# Patient Record
Sex: Female | Born: 1935 | Race: White | Hispanic: No | Marital: Married | Smoking: Never smoker
Health system: Southern US, Community
[De-identification: ages and names within clinical notes are randomized; demographics above are authoritative.]

## PROBLEM LIST (undated history)

## (undated) DIAGNOSIS — I341 Nonrheumatic mitral (valve) prolapse: Secondary | ICD-10-CM

## (undated) DIAGNOSIS — Z8619 Personal history of other infectious and parasitic diseases: Secondary | ICD-10-CM

## (undated) DIAGNOSIS — K219 Gastro-esophageal reflux disease without esophagitis: Secondary | ICD-10-CM

## (undated) DIAGNOSIS — I10 Essential (primary) hypertension: Secondary | ICD-10-CM

## (undated) DIAGNOSIS — I4891 Unspecified atrial fibrillation: Secondary | ICD-10-CM

## (undated) DIAGNOSIS — E785 Hyperlipidemia, unspecified: Secondary | ICD-10-CM

## (undated) HISTORY — DX: Personal history of other infectious and parasitic diseases: Z86.19

## (undated) HISTORY — PX: ABDOMINAL HYSTERECTOMY: SHX81

## (undated) HISTORY — DX: Hyperlipidemia, unspecified: E78.5

## (undated) HISTORY — PX: COLON SURGERY: SHX602

## (undated) HISTORY — DX: Gastro-esophageal reflux disease without esophagitis: K21.9

## (undated) HISTORY — DX: Nonrheumatic mitral (valve) prolapse: I34.1

## (undated) HISTORY — PX: TOE SURGERY: SHX1073

## (undated) HISTORY — DX: Unspecified atrial fibrillation: I48.91

## (undated) HISTORY — DX: Essential (primary) hypertension: I10

---

## 2008-05-12 ENCOUNTER — Ambulatory Visit: Payer: Self-pay | Admitting: Unknown Physician Specialty

## 2008-10-26 ENCOUNTER — Encounter: Payer: Self-pay | Admitting: Cardiovascular Disease

## 2008-11-11 ENCOUNTER — Encounter: Payer: Self-pay | Admitting: Cardiovascular Disease

## 2008-11-23 ENCOUNTER — Ambulatory Visit: Payer: Self-pay | Admitting: Internal Medicine

## 2009-04-26 ENCOUNTER — Encounter: Payer: Self-pay | Admitting: Cardiovascular Disease

## 2009-05-04 ENCOUNTER — Ambulatory Visit: Payer: Self-pay | Admitting: Cardiovascular Disease

## 2009-05-04 DIAGNOSIS — I10 Essential (primary) hypertension: Secondary | ICD-10-CM | POA: Insufficient documentation

## 2009-05-04 DIAGNOSIS — R0789 Other chest pain: Secondary | ICD-10-CM

## 2009-05-04 DIAGNOSIS — I4891 Unspecified atrial fibrillation: Secondary | ICD-10-CM

## 2009-05-04 DIAGNOSIS — E785 Hyperlipidemia, unspecified: Secondary | ICD-10-CM

## 2009-05-04 DIAGNOSIS — I059 Rheumatic mitral valve disease, unspecified: Secondary | ICD-10-CM | POA: Insufficient documentation

## 2009-05-04 DIAGNOSIS — R0602 Shortness of breath: Secondary | ICD-10-CM

## 2009-05-07 ENCOUNTER — Ambulatory Visit: Payer: Self-pay | Admitting: Cardiovascular Disease

## 2009-05-07 ENCOUNTER — Inpatient Hospital Stay: Payer: Self-pay | Admitting: Internal Medicine

## 2009-05-17 ENCOUNTER — Encounter: Payer: Self-pay | Admitting: Cardiovascular Disease

## 2009-05-17 ENCOUNTER — Ambulatory Visit: Payer: Self-pay | Admitting: Cardiovascular Disease

## 2009-05-17 LAB — CONVERTED CEMR LAB: POC INR: 3.6

## 2009-05-24 ENCOUNTER — Ambulatory Visit: Payer: Self-pay | Admitting: Internal Medicine

## 2009-05-24 LAB — CONVERTED CEMR LAB: POC INR: 3

## 2009-06-07 ENCOUNTER — Ambulatory Visit: Payer: Self-pay | Admitting: Internal Medicine

## 2009-06-14 ENCOUNTER — Ambulatory Visit: Payer: Self-pay | Admitting: Cardiovascular Disease

## 2009-06-16 ENCOUNTER — Encounter: Payer: Self-pay | Admitting: Cardiovascular Disease

## 2009-06-17 ENCOUNTER — Ambulatory Visit: Payer: Self-pay | Admitting: Cardiovascular Disease

## 2009-06-20 LAB — CONVERTED CEMR LAB
CO2: 24 meq/L (ref 19–32)
Calcium: 9.6 mg/dL (ref 8.4–10.5)
Creatinine, Ser: 0.98 mg/dL (ref 0.40–1.20)
Hemoglobin: 14 g/dL (ref 12.0–15.0)
INR: 2.37 — ABNORMAL HIGH (ref <1.50)
MCHC: 32.5 g/dL (ref 30.0–36.0)
Platelets: 188 10*3/uL (ref 150–400)
Prothrombin Time: 25.7 s — ABNORMAL HIGH (ref 11.6–15.2)
RDW: 14.8 % (ref 11.5–15.5)

## 2009-06-21 ENCOUNTER — Ambulatory Visit: Payer: Self-pay | Admitting: Cardiovascular Disease

## 2009-06-29 ENCOUNTER — Encounter: Payer: Self-pay | Admitting: Cardiovascular Disease

## 2009-06-29 ENCOUNTER — Ambulatory Visit: Payer: Self-pay | Admitting: Cardiovascular Disease

## 2009-06-29 LAB — CONVERTED CEMR LAB: POC INR: 2.3

## 2009-07-27 ENCOUNTER — Ambulatory Visit: Payer: Self-pay | Admitting: Cardiovascular Disease

## 2009-08-03 ENCOUNTER — Ambulatory Visit: Payer: Self-pay | Admitting: Cardiovascular Disease

## 2009-08-03 LAB — CONVERTED CEMR LAB: POC INR: 1.5

## 2009-08-12 ENCOUNTER — Ambulatory Visit: Payer: Self-pay | Admitting: Cardiovascular Disease

## 2009-08-24 ENCOUNTER — Ambulatory Visit: Payer: Self-pay | Admitting: Cardiovascular Disease

## 2009-09-14 ENCOUNTER — Ambulatory Visit: Payer: Self-pay | Admitting: Cardiovascular Disease

## 2009-10-12 ENCOUNTER — Ambulatory Visit: Payer: Self-pay | Admitting: Cardiology

## 2009-10-12 LAB — CONVERTED CEMR LAB: POC INR: 2.4

## 2009-11-09 ENCOUNTER — Ambulatory Visit: Payer: Self-pay | Admitting: Cardiology

## 2009-11-28 ENCOUNTER — Ambulatory Visit: Payer: Self-pay | Admitting: Internal Medicine

## 2009-12-07 ENCOUNTER — Ambulatory Visit: Payer: Self-pay | Admitting: Cardiovascular Disease

## 2010-01-04 ENCOUNTER — Ambulatory Visit: Payer: Self-pay | Admitting: Cardiovascular Disease

## 2010-01-04 LAB — CONVERTED CEMR LAB: POC INR: 2

## 2010-02-01 ENCOUNTER — Ambulatory Visit: Payer: Self-pay | Admitting: Cardiovascular Disease

## 2010-02-01 ENCOUNTER — Encounter: Payer: Self-pay | Admitting: Cardiovascular Disease

## 2010-02-01 DIAGNOSIS — I10 Essential (primary) hypertension: Secondary | ICD-10-CM

## 2010-02-01 LAB — CONVERTED CEMR LAB: POC INR: 1.6

## 2010-02-15 ENCOUNTER — Ambulatory Visit: Admission: RE | Admit: 2010-02-15 | Discharge: 2010-02-15 | Payer: Self-pay | Source: Home / Self Care

## 2010-03-08 ENCOUNTER — Ambulatory Visit: Admission: RE | Admit: 2010-03-08 | Discharge: 2010-03-08 | Payer: Self-pay | Source: Home / Self Care

## 2010-03-14 NOTE — Medication Information (Signed)
Summary: CCR/NE  Anticoagulant Therapy  Managed by: Cloyde Reams, RN, BSN Referring MD: Mariah Milling PCP: Yates Decamp, MD Supervising MD: Mariah Milling Indication 1: Atrial Fibrillation Lab Used: [facility] [place] Site: [place] INR POC 2.3 INR RANGE 2.0-3.0  Dietary changes: no    Health status changes: yes       Details: Incr swelling B LE after dependency, worse at night resolves by am.    Bleeding/hemorrhagic complications: no    Recent/future hospitalizations: no    Any changes in medication regimen? no    Recent/future dental: no  Any missed doses?: no       Is patient compliant with meds? yes       Current Medications (verified): 1)  Protonix 40 Mg Tbec (Pantoprazole Sodium) .Marland Kitchen.. 1po Once Daily 2)  Metoprolol Tartrate 50 Mg Tabs (Metoprolol Tartrate) .... 1/2 By Mouth Two Times A Day 3)  Fish Oil 1000 Mg Caps (Omega-3 Fatty Acids) .Marland Kitchen.. 1 By Mouth Two Times A Day 4)  Calcium Carbonate 600 Mg Tabs (Calcium Carbonate) .Marland Kitchen.. 1 By Mouth Once Daily 5)  Vitamin C Cr 500 Mg Cr-Tabs (Ascorbic Acid) .Marland Kitchen.. 1 By Mouth Once Daily 6)  Vitamin D 1000 Unit Tabs (Cholecalciferol) .Marland Kitchen.. 1 By Mouth Once Daily As Needed 7)  Vitamin E 1000 Unit Caps (Vitamin E) .Marland Kitchen.. 1 By Mouth Once Daily 8)  Aspirin 81 Mg Tbec (Aspirin) .... Take One Tablet By Mouth Daily 9)  Warfarin Sodium 5 Mg Tabs (Warfarin Sodium) .... As Directed By Anticoagulation Clinic 10)  Furosemide 20 Mg Tabs (Furosemide) .... Take One Tablet By Mouth Daily 11)  Potassium Chloride Crys Cr 20 Meq Cr-Tabs (Potassium Chloride Crys Cr) .... Take One Tablet By Mouth Daily 12)  Ramipril 2.5 Mg Caps (Ramipril) .... Take One Capsule By Mouth Daily 13)  Amlodipine Besylate 10 Mg Tabs (Amlodipine Besylate) .... Take One Tablet By Mouth Daily  Allergies: 1)  ! Amoxicillin  Anticoagulation Management History:      The patient is taking warfarin and comes in today for a routine follow up visit.  Positive risk factors for  bleeding include an age of 73 years or older.  The bleeding index is 'intermediate risk'.  Positive CHADS2 values include History of HTN.  Negative CHADS2 values include Age > 73 years old.  Her last INR was 2.37.  Anticoagulation responsible provider: [place].  INR POC: 2.3.  Cuvette Lot#: [id number].  Exp: 07/2010.    Anticoagulation Management Assessment/Plan:      The patient's current anticoagulation dose is Warfarin sodium 5 mg tabs: as directed by anticoagulation clinic.  The target INR is 2.0-3.0.  The next INR is due 10/12/2009.  Anticoagulation instructions were given to patient.  Results were reviewed/authorized by Cloyde Reams, RN, BSN.  She was notified by Cloyde Reams RN.         Prior Anticoagulation Instructions: INR 2.4  Continue on same dosage 1 tablet daily.  Recheck in 3 weeks.    Current Anticoagulation Instructions: INR 2.3  Continue on same dosage 1 tablet daily.  Recheck in 4 weeks.

## 2010-03-14 NOTE — Medication Information (Signed)
Summary: rov/ewj  Anticoagulant Therapy  Managed by: Bethena Midget, RN, BSN Referring MD: Mariah Milling PCP: Yates Decamp, MD Supervising MD: Mariah Milling MD, TIM  Indication 1: Atrial Fibrillation Lab Used: [facility] [place] Site: [place] INR POC 2.0 INR RANGE 2.0-3.0  Dietary changes: no    Health status changes: no    Bleeding/hemorrhagic complications: no    Recent/future hospitalizations: no    Any changes in medication regimen? no    Recent/future dental: no  Any missed doses?: no       Is patient compliant with meds? yes       Allergies: 1)  ! Amoxicillin  Anticoagulation Management History:      The patient is taking warfarin and comes in today for a routine follow up visit.  Positive risk factors for bleeding include an age of 73 years or older.  The bleeding index is 'intermediate risk'.  Positive CHADS2 values include History of HTN.  Negative CHADS2 values include Age > 79 years old.  Her last INR was 2.37.  Anticoagulation responsible provider: Mariah Milling MD, TIM .  INR POC: 2.0.  Cuvette Lot#: [id number].  Exp: 01/2011.    Anticoagulation Management Assessment/Plan:      The patient's current anticoagulation dose is Warfarin sodium 5 mg tabs: as directed by anticoagulation clinic.  The target INR is 2.0-3.0.  The next INR is due 02/01/2010.  Anticoagulation instructions were given to patient.  Results were reviewed/authorized by Bethena Midget, RN, BSN.  She was notified by Bethena Midget, RN, BSN.         Prior Anticoagulation Instructions: INR 2.1  Continue on same dosage 1 tablet daily.  Recheck in 4 weeks.    Current Anticoagulation Instructions: INR 2.0 Today take extra 2.5mg  then Continue 5mg s daily. Recheck in 4 weeks.

## 2010-03-14 NOTE — Assessment & Plan Note (Signed)
Summary: EKG/AMD  Nurse Visit   Vital Signs:  Patient profile:   75 year old female Pulse rate:   70 / minute BP sitting:   110 / 74  (left arm) Cuff size:   large  Vitals Entered By: Cloyde Reams RN (July 27, 2009 11:00 AM)  Visit Type:  Nurse visit Referring Provider:  Yates Decamp, MD Primary Provider:  Yates Decamp, MD  CC:  Pt c/o feeling like she was going to pass out yesterday when reaching or bending became very nauseated.  History of Present Illness: EKG shows pt in a-fib, last EKG NSR holding Digoxin d/t bradycardia. Per Dr Mariah Milling coumadin is subtherapeutic so will not try to get pt back in rhythm at present d/t incr clot risk with low INR.  Pt's rate is controlled at present.  Will boost coumadin, have pt monitor HR and BP over the next week and make OV with Dr Mariah Milling and Coumadin for 1 week.     Patient Instructions: 1)  Your physician recommends that you schedule a follow-up appointment in: 1 week 2)  Your physician recommends that you continue on your current medications as directed. Please refer to the Current Medication list given to you today.   Allergies: 1)  ! Amoxicillin

## 2010-03-14 NOTE — Medication Information (Signed)
Summary: CCR/AMD  Anticoagulant Therapy  Managed by: Charlena Cross, RN, BSN Referring MD: Mariah Milling PCP: Yates Decamp, MD Supervising MD: Gala Romney MD, Archibald Marchetta Indication 1: Atrial Fibrillation Lab Used: [facility] [place] Site: [place] INR POC 2.9 INR RANGE 2.0-3.0  Dietary changes: no    Health status changes: no    Bleeding/hemorrhagic complications: no    Recent/future hospitalizations: no    Any changes in medication regimen? no    Recent/future dental: no  Any missed doses?: no       Is patient compliant with meds? yes       Allergies: 1)  ! Amoxicillin  Anticoagulation Management History:      The patient is taking warfarin and comes in today for a routine follow up visit.  Positive risk factors for bleeding include an age of 75 years or older.  The bleeding index is 'intermediate risk'.  Positive CHADS2 values include History of HTN.  Negative CHADS2 values include Age > 5 years old.  Anticoagulation responsible provider: Shekela Goodridge MD, Reuel Boom.  INR POC: 2.9.    Anticoagulation Management Assessment/Plan:      The patient's current anticoagulation dose is Warfarin sodium 5 mg tabs: as directed by anticoagulation clinic.  The target INR is 2.0-3.0.  The next INR is due 07/05/2009.  Anticoagulation instructions were given to patient.  Results were reviewed/authorized by Charlena Cross, RN, BSN.  She was notified by Charlena Cross, RN, BSN.         Prior Anticoagulation Instructions: extra serving of greens today then coumadin 5 mg daily with 2.5 mg on T TH Sa  Current Anticoagulation Instructions: The patient is to continue with the same dose of coumadin.  This dosage includes: coumadin 5 mg daily with 2.5 mg on T Th Sa

## 2010-03-14 NOTE — Medication Information (Signed)
Summary: Coumadin Clinic  Anticoagulant Therapy  Managed by: Cloyde Reams, RN, BSN Referring MD: Mariah Milling PCP: Yates Decamp, MD Supervising MD: Mariah Milling Indication 1: Atrial Fibrillation Lab Used: [facility] [place] Site: [place] INR POC 2.3 INR RANGE 2.0-3.0  Dietary changes: no    Health status changes: no    Bleeding/hemorrhagic complications: no    Recent/future hospitalizations: no    Any changes in medication regimen? no    Recent/future dental: no  Any missed doses?: no       Is patient compliant with meds? yes       Allergies: 1)  ! Amoxicillin  Anticoagulation Management History:      The patient is taking warfarin and comes in today for a routine follow up visit.  Positive risk factors for bleeding include an age of 75 years or older.  The bleeding index is 'intermediate risk'.  Positive CHADS2 values include History of HTN.  Negative CHADS2 values include Age > 32 years old.  Her last INR was 2.37.  Anticoagulation responsible provider: Jacory Kamel.  INR POC: 2.3.  Cuvette Lot#: [id number].  Exp: 09/2010.    Anticoagulation Management Assessment/Plan:      The patient's current anticoagulation dose is Warfarin sodium 5 mg tabs: as directed by anticoagulation clinic.  The target INR is 2.0-3.0.  The next INR is due 07/27/2009.  Anticoagulation instructions were given to patient.  Results were reviewed/authorized by Cloyde Reams, RN, BSN.  She was notified by Cloyde Reams RN.         Current Anticoagulation Instructions: INR 2.3  Continue on same dosage 5mg  daily except 2.5mg  on Tuesdays, Thursdays, and Saturdays.  Recheck in 4 weeks.

## 2010-03-14 NOTE — Medication Information (Signed)
Summary: rov/ewj  Anticoagulant Therapy  Managed by: Cloyde Reams, RN, BSN Referring MD: Mariah Milling PCP: Yates Decamp, MD Supervising MD: Shirlee Latch MD, Kyrene Longan Indication 1: Atrial Fibrillation Lab Used: [facility] [place] Site: [place] INR POC 2.1 INR RANGE 2.0-3.0  Dietary changes: no    Health status changes: no    Bleeding/hemorrhagic complications: no    Recent/future hospitalizations: no    Any changes in medication regimen? no    Recent/future dental: no  Any missed doses?: no       Is patient compliant with meds? yes       Allergies: 1)  ! Amoxicillin  Anticoagulation Management History:      The patient is taking warfarin and comes in today for a routine follow up visit.  Positive risk factors for bleeding include an age of 75 years or older.  The bleeding index is 'intermediate risk'.  Positive CHADS2 values include History of HTN.  Negative CHADS2 values include Age > 73 years old.  Her last INR was 2.37.  Anticoagulation responsible provider: Shirlee Latch MD, Fredrich Cory.  INR POC: 2.1.  Cuvette Lot#: [id number].  Exp: 12/2010.    Anticoagulation Management Assessment/Plan:      The patient's current anticoagulation dose is Warfarin sodium 5 mg tabs: as directed by anticoagulation clinic.  The target INR is 2.0-3.0.  The next INR is due 12/07/2009.  Anticoagulation instructions were given to patient.  Results were reviewed/authorized by Cloyde Reams, RN, BSN.  She was notified by Cloyde Reams RN.         Prior Anticoagulation Instructions: INR 2.4  Continue on same dosage 1 tablet daily.  Recheck in 4 weeks.    Current Anticoagulation Instructions: INR 2.1  Continue on same dosage 1 tablet daily.  Recheck in 4 weeks.

## 2010-03-14 NOTE — Progress Notes (Signed)
Summary: [facility]   Imported By: Harlon Flor 05/05/2009 08:48:32  _____________________________________________________________________  External Attachment:    Type:   Image     Comment:   External Document

## 2010-03-14 NOTE — Assessment & Plan Note (Signed)
Summary: F1W/CCR/AMD   Visit Type:  Follow-up Referring Provider:  Yates Decamp, MD Primary Provider:  Yates Decamp, MD  CC:  no cardiac problems.  History of Present Illness: 75 year old woman, patient of Dr. Dan Humphreys, presents for followup for atrial fibrillation.  She reports that overall she has been doing well though she does occasionally have days when she feels tired. We have been decreasing her medications do to the symptoms of bradycardia and fatigue. Initially after the cardioversion where we achieved normal sinus rhythm, we decreased her metoprolol from 50 b.i.d. for bradycardia to 25 b.i.d. She continued to feel malaise and fatigue and we held her digoxin.  last week we did an EKG and she had converted back to atrial fibrillation. She does not feel significantly different. 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 She does have some shortness of breath if she pulls and irrigation hose. She does not do any exercise program.  Echocardiogram March 27 shows mild to moderately depressed EF, ejection fraction 40%, this was noted while in atrial fibrillation. Mild-to-moderate mitral regurgitation, mildly elevated right ventricular systolic pressure. This was prior to her diuresis and rhythm control at [facility].  EKG today shows atrial fibrillation with ventricular rate 85 beats per minute, rare PVC, no significant ST or T wave changes. Left anterior fascicular block, right bundle branch block.  Current Medications (verified): 1)  Protonix 40 Mg Tbec (Pantoprazole Sodium) .Marland Kitchen.. 1po Once Daily 2)  Metoprolol Tartrate 50 Mg Tabs (Metoprolol Tartrate) .... 1/2 By Mouth Two Times A Day 3)  Fish Oil 1000 Mg Caps (Omega-3 Fatty Acids) .Marland Kitchen.. 1 By Mouth Two Times A Day 4)  Calcium Carbonate 600 Mg Tabs (Calcium Carbonate) .Marland Kitchen.. 1 By Mouth Once Daily 5)  Vitamin C Cr 500 Mg Cr-Tabs (Ascorbic Acid) .Marland Kitchen.. 1 By Mouth Once Daily 6)  Vitamin D 1000 Unit Tabs (Cholecalciferol) .Marland Kitchen.. 1 By  Mouth Once Daily As Needed 7)  Vitamin E 1000 Unit Caps (Vitamin E) .Marland Kitchen.. 1 By Mouth Once Daily 8)  Aspirin 81 Mg Tbec (Aspirin) .... Take One Tablet By Mouth Daily 9)  Warfarin Sodium 5 Mg Tabs (Warfarin Sodium) .... As Directed By Anticoagulation Clinic 10)  Furosemide 20 Mg Tabs (Furosemide) .... Take One Tablet By Mouth Daily 11)  Potassium Chloride Crys Cr 20 Meq Cr-Tabs (Potassium Chloride Crys Cr) .... Take One Tablet By Mouth Daily 12)  Ramipril 2.5 Mg Caps (Ramipril) .... Take One Capsule By Mouth Daily 13)  Amlodipine Besylate 10 Mg Tabs (Amlodipine Besylate) .... Take One Tablet By Mouth Daily  Allergies (verified): 1)  ! Amoxicillin  Past History:  Past Medical History: Last updated: 05/07/2009 Atrial Fibrillation [facility] Hyperlipidemia Hypertension Mitral Valve Prolapse  Past Surgical History: Last updated: 2009-05-07 Abdominal Hysterectomy-Total Colon cancer surgery toe sew back on  Family History: Last updated: May 07, 2009 Father: deceased 65: lots health problems, CABG Mother: deceased 67: thyroid cancer Sister: deceased 68: pancreatic cancer Brother: living: 2 healthy  Social History: Last updated: 05/07/2009 Retired  Married  Tobacco Use - No.  Alcohol Use - no Regular Exercise - yes Drug Use - no  Risk Factors: Alcohol Use: 0 (05/07/09) Caffeine Use: 1.5 cup (May 07, 2009) Exercise: yes (2009/05/07)  Risk Factors: Smoking Status: never (May 07, 2009)  Review of Systems  The patient denies fever, weight loss, weight gain, vision loss, decreased hearing, hoarseness, chest pain, syncope, dyspnea on exertion, peripheral edema, prolonged cough, abdominal pain, incontinence, muscle weakness, depression, and enlarged lymph nodes.  Fatigue  Vital Signs:  Patient profile:   75 year old female Height:      63 inches Weight:      162 pounds Pulse rate:   85 / minute BP sitting:   118 / 82  (left arm) Cuff size:   regular  Vitals Entered  By: Bishop Dublin, CMA (August 03, 2009 10:22 AM)  Physical Exam  General:  Well developed, well nourished, in no acute distress. Head:  normocephalic and atraumatic Neck:  Neck supple, no JVD. No masses, thyromegaly or abnormal cervical nodes. Lungs:  Clear bilaterally to auscultation and percussion. Heart:  Non-displaced PMI, chest non-tender; irregular rate and rhythm, S1, S2 with II/VI SEM  murmurs, no rubs or gallops. Carotid upstroke normal, no bruit. . Pedals normal pulses. No edema, no varicosities. Abdomen:  Bowel sounds positive; abdomen soft and non-tender without masses Msk:  Back normal, normal gait. Muscle strength and tone normal. Pulses:  pulses normal in all 4 extremities Extremities:  No clubbing or cyanosis. Neurologic:  Alert and oriented x 3. Skin:  Intact without lesions or rashes. Psych:  Normal affect.   Impression & Recommendations:  Problem # 1:  ATRIAL FIBRILLATION (ICD-427.31) after a cardioversion last month, she has converted back to atrial fibrillation. We had to decrease her rate and rhythm control medications 22 malaise and fatigue and this may have contributed to her converting back to her arrhythmia.  Given her diastolic dysfunction, mild to moderate mitral regurgitation with mitral valve prolapse, mildly depressed ejection fraction, we will manage her medically with rate control and warfarin. I have encouraged her to increase her exercise as she may not be doing much aerobic with her gardening.  I have mentioned to her that she may need a higher dose metoprolol or we may need to restart some digoxin if her rate increases with exertion and she gets chest tightness or more shortness of breath with heavy exertion. 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   Her INR is 1.5 today and we will increase her dose to achieve 2.0 or greater.  Her updated medication list for this problem includes:    Metoprolol  Tartrate 50 Mg Tabs (Metoprolol tartrate) .Marland Kitchen... 1/2 by mouth two times a day    Aspirin 81 Mg Tbec (Aspirin) .Marland Kitchen... Take one tablet by mouth daily    Warfarin Sodium 5 Mg Tabs (Warfarin sodium) .Marland Kitchen... As directed by anticoagulation clinic  Problem # 2:  MITRAL VALVE PROLAPSE (ICD-424.0) Mild-to-moderate mitral regurgitation with valve prolapse. This may also be contributing to some of her shortness of breath with exertion.  Her updated medication list for this problem includes:    Metoprolol Tartrate 50 Mg Tabs (Metoprolol tartrate) .Marland Kitchen... 1/2 by mouth two times a day    Furosemide 20 Mg Tabs (Furosemide) .Marland Kitchen... Take one tablet by mouth daily    Ramipril 2.5 Mg Caps (Ramipril) .Marland Kitchen... Take one capsule by mouth daily  Patient Instructions: 1)  Your physician wants you to follow-up in:  6 months  You will receive a reminder letter in the mail two months in advance. If you don't receive a letter, please call our office to schedule the follow-up appointment. Prescriptions: WARFARIN SODIUM 5 MG TABS (WARFARIN SODIUM) as directed by anticoagulation clinic  #40 x 3   Entered by:   Cloyde Reams RN   Authorized by:   Dossie Arbour MD   Signed by:   Cloyde Reams  RN on 08/03/2009   Method used:   Electronically to        [facility] (retail)       [address], [address]       [place], [place]  [address]       Ph: [phone]       Fax: [phone]   RxID:   [id number]

## 2010-03-14 NOTE — Medication Information (Signed)
Summary: CCR/GLC  Anticoagulant Therapy  Managed by: Cloyde Reams, RN, BSN Referring MD: Mariah Milling PCP: Yates Decamp, MD Supervising MD: Mariah Milling Indication 1: Atrial Fibrillation Lab Used: [facility] [place] Site: [place] INR POC 2.3 INR RANGE 2.0-3.0  Dietary changes: no     Bleeding/hemorrhagic complications: no     Any changes in medication regimen? no     Any missed doses?: no       Is patient compliant with meds? yes       Allergies: 1)  ! Amoxicillin  Anticoagulation Management History:      The patient is taking warfarin and comes in today for a routine follow up visit.  Positive risk factors for bleeding include an age of 73 years or older.  The bleeding index is 'intermediate risk'.  Positive CHADS2 values include History of HTN.  Negative CHADS2 values include Age > 73 years old.  Her last INR was 2.37.  Anticoagulation responsible provider: Braylon Grenda.  INR POC: 2.3.  Cuvette Lot#: [id number].  Exp: 10/2010.    Anticoagulation Management Assessment/Plan:      The patient's current anticoagulation dose is Warfarin sodium 5 mg tabs: as directed by anticoagulation clinic.  The target INR is 2.0-3.0.  The next INR is due 08/24/2009.  Anticoagulation instructions were given to patient.  Results were reviewed/authorized by Cloyde Reams, RN, BSN.  She was notified by Benedict Needy, RN.         Prior Anticoagulation Instructions: INR 1.5  Per Dr Mariah Milling Take 1.5 tablets today then start taking 1 tablet daily.  Recheck in 10 days.    Current Anticoagulation Instructions: INR 2.3  Continue same dose of 1 tab daily and recheck in 2 weeks.

## 2010-03-14 NOTE — Medication Information (Signed)
Summary: [facility]  Anticoagulant Therapy  Managed by: Cloyde Reams, RN, BSN Referring MD: Mariah Milling PCP: Yates Decamp, MD Supervising MD: Shirlee Latch MD, [place] Indication 1: Atrial Fibrillation Lab Used: [facility] [place] Site: [place] INR POC 2.1 INR RANGE 2.0-3.0  Dietary changes: no    Health status changes: no    Bleeding/hemorrhagic complications: no    Recent/future hospitalizations: no    Any changes in medication regimen? no    Recent/future dental: no  Any missed doses?: no       Is patient compliant with meds? yes       Allergies: 1)  ! Amoxicillin  Anticoagulation Management History:      The patient is taking warfarin and comes in today for a routine follow up visit.  Positive risk factors for bleeding include an age of 17 years or older.  The bleeding index is 'intermediate risk'.  Positive CHADS2 values include History of HTN.  Negative CHADS2 values include Age > 75 years old.  Her last INR was 2.37.  Anticoagulation responsible Anivea Velasques: Shirlee Latch MD, [place].  INR POC: 2.1.  Cuvette Lot#: [id number].  Exp: 2012.    Anticoagulation Management Assessment/Plan:      The patient's current anticoagulation dose is Warfarin sodium 5 mg tabs: as directed by anticoagulation clinic.  The target INR is 2.0-3.0.  The next INR is due 01/04/2010.  Anticoagulation instructions were given to patient.  Results were reviewed/authorized by Cloyde Reams, RN, BSN.  She was notified by Cloyde Reams RN.         Prior Anticoagulation Instructions: INR 2.1  Continue on same dosage 1 tablet daily.  Recheck in 4 weeks.    Current Anticoagulation Instructions: Same as Prior Instructions.

## 2010-03-14 NOTE — Assessment & Plan Note (Signed)
Summary: POST HOSPITAL F/U 1 WEEK PER Aurorah Schlachter   Visit Type:  post hospital Referring Provider:  Yates Decamp, MD Primary Provider:  Yates Decamp, MD  CC:  no chest discomfort, some reflux problems last night, some sob, and some edema but not bad..  History of Present Illness: 75 year old woman, patient of Dr. Dan Humphreys, presents for followup for atrial fibrillation.  she presents today for followup after her cardioversion last week. We successfully cardioverted her to normal sinus rhythm. She presents today and states that she does not feel as well as she was hoping that she would. She has malaise, weakness, fatigue. She has been keeping track of her blood pressure numbers and heart rates and her blood pressure has been well-controlled no heart rate is low in the 40s and 50s.  Echocardiogram March 27 shows mild to moderately depressed EF, ejection fraction 40%, this was noted while in nature fibrillation. Mild-to-moderate mitral regurgitation, mildly elevated right ventricular systolic pressure. This was prior to her diuresis and rhythm control at [facility].  Current Problems (verified): 1)  Encounter For Long-term Use of Anticoagulants  (ICD-V58.61) 2)  Other and Unspecified Hyperlipidemia  (ICD-272.4) 3)  Unspecified Essential Hypertension  (ICD-401.9) 4)  Mitral Valve Prolapse  (ICD-424.0) 5)  Shortness of Breath  (ICD-786.05) 6)  Other Chest Pain  (ICD-786.59) 7)  Atrial Fibrillation  (ICD-427.31)  Current Medications (verified): 1)  Protonix 40 Mg Tbec (Pantoprazole Sodium) .Marland Kitchen.. 1po Once Daily 2)  Metoprolol Tartrate 50 Mg Tabs (Metoprolol Tartrate) .... 1/2 By Mouth Two Times A Day 3)  Fish Oil 1000 Mg Caps (Omega-3 Fatty Acids) .Marland Kitchen.. 1 By Mouth Two Times A Day 4)  Calcium Carbonate 600 Mg Tabs (Calcium Carbonate) .Marland Kitchen.. 1 By Mouth Once Daily 5)  Vitamin C Cr 500 Mg Cr-Tabs (Ascorbic Acid) .Marland Kitchen.. 1 By Mouth Once Daily 6)  Vitamin D 1000 Unit Tabs (Cholecalciferol) .Marland Kitchen.. 1 By Mouth Once Daily As  Needed 7)  Vitamin E 1000 Unit Caps (Vitamin E) .Marland Kitchen.. 1 By Mouth Once Daily 8)  Aspirin 81 Mg Tbec (Aspirin) .... Take One Tablet By Mouth Daily 9)  Warfarin Sodium 5 Mg Tabs (Warfarin Sodium) .... As Directed By Anticoagulation Clinic 10)  Furosemide 20 Mg Tabs (Furosemide) .... Take One Tablet By Mouth Daily 11)  Potassium Chloride Crys Cr 20 Meq Cr-Tabs (Potassium Chloride Crys Cr) .... Take One Tablet By Mouth Daily 12)  Ramipril 2.5 Mg Caps (Ramipril) .... Take One Capsule By Mouth Daily 13)  Digoxin 0.25 Mg Tabs (Digoxin) .... Take One Tablet By Mouth Daily 14)  Amlodipine Besylate 10 Mg Tabs (Amlodipine Besylate) .... Take One Tablet By Mouth Daily  Allergies (verified): 1)  ! Amoxicillin  Past History:  Past Medical History: Last updated: 05-30-09 Atrial Fibrillation [facility] Hyperlipidemia Hypertension Mitral Valve Prolapse  Past Surgical History: Last updated: May 30, 2009 Abdominal Hysterectomy-Total Colon cancer surgery toe sew back on  Family History: Last updated: 05-30-09 Father: deceased 29: lots health problems, CABG Mother: deceased 64: thyroid cancer Sister: deceased 59: pancreatic cancer Brother: living: 2 healthy  Social History: Last updated: 05-30-2009 Retired  Married  Tobacco Use - No.  Alcohol Use - no Regular Exercise - yes Drug Use - no  Risk Factors: Alcohol Use: 0 (May 30, 2009) Caffeine Use: 1.5 cup (05-30-09) Exercise: yes (05/30/09)  Risk Factors: Smoking Status: never (May 30, 2009)  Review of Systems  The patient denies fever, weight loss, weight gain, vision loss, decreased hearing, hoarseness, chest pain, syncope, dyspnea on exertion, peripheral edema, prolonged  cough, abdominal pain, incontinence, muscle weakness, depression, and enlarged lymph nodes.         weak, malasie, food dose not taste right  Vital Signs:  Patient profile:   75 year old female Height:      63 inches Weight:      167.25 pounds BMI:      29.73 Pulse rate:   74 / minute Pulse rhythm:   regular BP sitting:   92 / 76  (left arm) Cuff size:   large  Vitals Entered By: Mercer Pod (Jun 29, 2009 2:29 PM)  Physical Exam  General:  well-appearing woman in no apparent distress, HEENT exam is benign, oropharynx is clear, neck is supple with no JVP or carotid bruit, heart sounds are regular with S1-S2 and no murmurs appreciated, lungs are clear to auscultation with no wheezes rales, abdominal exam is benign, no significant lower extremity edema, neurologic exam is nonfocal, skin is warm and dry, pulses are equal and symmetrical in her upper and lower extremities.    EKG  Procedure date:  06/29/2009  Findings:      normal sinus rhythm with a bigeminal pattern with PVCs, left anterior fascicular block.  Impression & Recommendations:  Problem # 1:  ATRIAL FIBRILLATION (ICD-427.31) she is maintaining normal sinus rhythm today. Her rate is slow though in a bigeminal pattern.  She does not feel well, and has bradycardia at home per her numbers though I suspect that some of this may be due to ectopy. In an effort to see if we can make her feel better with more energy, we'll hold her digoxin. I've asked her to continue to monitor her heart rate and blood pressure and contact our office if her heart rate is not increase.  We will have her follow up with an EKG in one month and follow up in our office in 3 months.  The following medications were removed from the medication list:    Digoxin 0.25 Mg Tabs (Digoxin) .Marland Kitchen... Take one tablet by mouth daily Her updated medication list for this problem includes:    Metoprolol Tartrate 50 Mg Tabs (Metoprolol tartrate) .Marland Kitchen... 1/2 by mouth two times a day    Aspirin 81 Mg Tbec (Aspirin) .Marland Kitchen... Take one tablet by mouth daily    Warfarin Sodium 5 Mg Tabs (Warfarin sodium) .Marland Kitchen... As directed by anticoagulation clinic  Problem # 2:  UNSPECIFIED ESSENTIAL HYPERTENSION (ICD-401.9) Blood pressure  is well-controlled on her current medications. Her diltiazem was changed to amlodipine after the cardioversion.  Her updated medication list for this problem includes:    Metoprolol Tartrate 50 Mg Tabs (Metoprolol tartrate) .Marland Kitchen... 1/2 by mouth two times a day    Aspirin 81 Mg Tbec (Aspirin) .Marland Kitchen... Take one tablet by mouth daily    Furosemide 20 Mg Tabs (Furosemide) .Marland Kitchen... Take one tablet by mouth daily    Ramipril 2.5 Mg Caps (Ramipril) .Marland Kitchen... Take one capsule by mouth daily    Amlodipine Besylate 10 Mg Tabs (Amlodipine besylate) .Marland Kitchen... Take one tablet by mouth daily  Patient Instructions: 1)  Your physician recommends that you schedule a follow-up appointment in: 1 month for an EKG and in 3 months with Dr Mariah Milling 2)  Your physician has recommended you make the following change in your medication: Discontinue Digoxin.

## 2010-03-14 NOTE — Medication Information (Signed)
Summary: CCR/AMD  Anticoagulant Therapy  Managed by: Charlena Cross, RN, BSN Referring MD: Mariah Milling PCP: Yates Decamp, MD Supervising MD: Gala Romney MD, Jasper Ruminski Indication 1: Atrial Fibrillation Lab Used: [facility] Point of [facility] [place] Site: [place] INR POC 3.0 INR RANGE 2.0-3.0  Dietary changes: no    Health status changes: no    Bleeding/hemorrhagic complications: no    Recent/future hospitalizations: no    Any changes in medication regimen? no    Recent/future dental: no  Any missed doses?: no       Is patient compliant with meds? yes       Allergies: 1)  ! Amoxicillin  Anticoagulation Management History:      The patient is taking warfarin and comes in today for a routine follow up visit.  Positive risk factors for bleeding include an age of 73 years or older.  The bleeding index is 'intermediate risk'.  Positive CHADS2 values include History of HTN.  Negative CHADS2 values include Age > 98 years old.  Anticoagulation responsible provider: Thersa Mohiuddin MD, Reuel Boom.  INR POC: 3.0.    Anticoagulation Management Assessment/Plan:      The patient's current anticoagulation dose is Warfarin sodium 5 mg tabs: as directed by anticoagulation clinic.  The target INR is 2.0-3.0.  The next INR is due 06/07/2009.  Anticoagulation instructions were given to patient.  Results were reviewed/authorized by Charlena Cross, RN, BSN.  She was notified by Charlena Cross, RN, BSN.         Prior Anticoagulation Instructions: no coumadin today then coumadin 5 mg daily with 2.5 mg on Tues and Thurs.   Current Anticoagulation Instructions: extra serving of greens today then coumadin 5 mg daily with 2.5 mg on T TH Sa

## 2010-03-14 NOTE — Medication Information (Signed)
Summary: Coumadin Clinic  Anticoagulant Therapy  Managed by: Cloyde Reams, RN, BSN Referring MD: Mariah Milling PCP: Yates Decamp, MD Supervising MD: Mariah Milling Indication 1: Atrial Fibrillation Lab Used: [facility] [place] Site: [place] INR POC 1.5 INR RANGE 2.0-3.0  Dietary changes: no     Bleeding/hemorrhagic complications: no     Any changes in medication regimen? no     Any missed doses?: no       Is patient compliant with meds? yes       Allergies: 1)  ! Amoxicillin  Anticoagulation Management History:      The patient is taking warfarin and comes in today for a routine follow up visit.  Positive risk factors for bleeding include an age of 65 years or older.  The bleeding index is 'intermediate risk'.  Positive CHADS2 values include History of HTN.  Negative CHADS2 values include Age > 30 years old.  Her last INR was 2.37.  Anticoagulation responsible provider: Josten Warmuth.  INR POC: 1.5.  Cuvette Lot#: [id number].  Exp: 10/2010.    Anticoagulation Management Assessment/Plan:      The patient's current anticoagulation dose is Warfarin sodium 5 mg tabs: as directed by anticoagulation clinic.  The target INR is 2.0-3.0.  The next INR is due 08/12/2009.  Anticoagulation instructions were given to patient.  Results were reviewed/authorized by Cloyde Reams, RN, BSN.  She was notified by Cloyde Reams RN.         Prior Anticoagulation Instructions: INR 1.5   Take 1.5 tablets today, then start taking 1 tablet daily except 1/2 tablet on Tuesdays and Saturdays.  Recheck in 1 week.    Current Anticoagulation Instructions: INR 1.5  Take 1.5 tablets today, then start taking 1 tablet daily except 1/2 tablet on Tuesdays.  Recheck in 10 days- 2 weeks.    Appended Document: Coumadin Clinic    Anticoagulant Therapy  Managed by: Cloyde Reams, RN, BSN Referring MD: Mariah Milling PCP: Yates Decamp, MD Supervising MD: Mariah Milling Indication 1: Atrial Fibrillation Lab Used: [facility]  Point of [facility] [place] Site: [place] INR POC 1.5 INR RANGE 2.0-3.0           Allergies: 1)  ! Amoxicillin  Anticoagulation Management History:      The patient is taking warfarin and comes in today for a routine follow up visit.  Positive risk factors for bleeding include an age of 103 years or older.  The bleeding index is 'intermediate risk'.  Positive CHADS2 values include History of HTN.  Negative CHADS2 values include Age > 58 years old.  Her last INR was 2.37.  Anticoagulation responsible provider: Kirklin Mcduffee.  INR POC: 1.5.  Exp: 10/2010.    Anticoagulation Management Assessment/Plan:      The patient's current anticoagulation dose is Warfarin sodium 5 mg tabs: as directed by anticoagulation clinic.  The target INR is 2.0-3.0.  The next INR is due 08/12/2009.  Anticoagulation instructions were given to patient.  Results were reviewed/authorized by Cloyde Reams, RN, BSN.  She was notified by Cloyde Reams RN.         Prior Anticoagulation Instructions: INR 1.5  Take 1.5 tablets today, then start taking 1 tablet daily except 1/2 tablet on Tuesdays.  Recheck in 10 days- 2 weeks.    Current Anticoagulation Instructions: INR 1.5  Per Dr Mariah Milling Take 1.5 tablets today then start taking 1 tablet daily.  Recheck in 10 days.

## 2010-03-14 NOTE — Assessment & Plan Note (Signed)
Summary: F1M/AMD   Visit Type:  Follow-up Referring Provider:  Yates Decamp, MD Primary Provider:  Yates Decamp, MD  CC:  Patient is having 4 to 5 bowel movement a day and concern about being hot when she wakes up in the morning. Patient go riding on long distances she gets tired  form the ride. She have being having indigestion type symptoms and is concern about that. Patient occasionally have SOB early in the morning and went outside to get fresh air and she felt better.Patient is extremely concern that she does not feel well. Patient is concern about small red lesions at chest area and on stomach.  History of Present Illness: 75 year old woman, patient of Dr. Dan Humphreys, presents for followup for atrial fibrillation.  overall, she states that she does not feel very well. Her husband has also noticed that she is not herself. She has been taking her medications on a consistent basis. She provides blood pressures and heart rates in per her numbers, her heart rate has been very low. When checked in the office, her heart rates are within normal range. On her last clinic visit, he estimated heart rate was lower than what was seen on EKG.  EKG in the office shows atrial fibrillation with rate 92 beats per minute.  Echocardiogram March 27 shows mild to moderately depressed EF, ejection fraction 40%, this was noted while in nature fibrillation. Mild-to-moderate mitral regurgitation, mildly elevated right ventricular systolic pressure. This was prior to her diuresis and rhythm control at [facility].  Current Medications (verified): 1)  Protonix 40 Mg Tbec (Pantoprazole Sodium) .Marland Kitchen.. 1po Once Daily 2)  Metoprolol Tartrate 50 Mg Tabs (Metoprolol Tartrate) .... Take 1  Tablet By Mouth Twice A Day 3)  Fish Oil 1000 Mg Caps (Omega-3 Fatty Acids) .Marland Kitchen.. 1 By Mouth Two Times A Day 4)  Calcium Carbonate 600 Mg Tabs (Calcium Carbonate) .Marland Kitchen.. 1 By Mouth Once Daily 5)  Vitamin C Cr 500 Mg Cr-Tabs (Ascorbic Acid) .Marland Kitchen.. 1 By  Mouth Once Daily 6)  Vitamin D 1000 Unit Tabs (Cholecalciferol) .Marland Kitchen.. 1 By Mouth Once Daily As Needed 7)  Vitamin E 1000 Unit Caps (Vitamin E) .Marland Kitchen.. 1 By Mouth Once Daily 8)  Aspirin 81 Mg Tbec (Aspirin) .... Take One Tablet By Mouth Daily 9)  Diltiazem Hcl Er Beads 120 Mg Xr24h-Cap (Diltiazem Hcl Er Beads) .... Take One Capsule By Mouth Daily 10)  Warfarin Sodium 5 Mg Tabs (Warfarin Sodium) .... As Directed By Anticoagulation Clinic 11)  Furosemide 20 Mg Tabs (Furosemide) .... Take One Tablet By Mouth Daily 12)  Potassium Chloride Crys Cr 20 Meq Cr-Tabs (Potassium Chloride Crys Cr) .... Take One Tablet By Mouth Daily 13)  Ramipril 2.5 Mg Caps (Ramipril) .... Take One Capsule By Mouth Daily 14)  Digoxin 0.25 Mg Tabs (Digoxin) .... Take One Tablet By Mouth Daily  Allergies (verified): 1)  ! Amoxicillin  Review of Systems  The patient denies fever, weight loss, weight gain, vision loss, decreased hearing, hoarseness, chest pain, syncope, dyspnea on exertion, peripheral edema, prolonged cough, abdominal pain, incontinence, muscle weakness, depression, and enlarged lymph nodes.         Fatigue, malaise  Vital Signs:  Patient profile:   76 year old female Height:      63 inches Weight:      171.50 pounds BMI:     30.49 Pulse rate:   62 / minute BP sitting:   128 / 68  (left arm) Cuff size:   large  Physical  Exam  General:  well-appearing woman in no apparent distress, HEENT exam is benign, oropharynx is clear, neck is supple with no JVP or carotid bruits, heart sounds are irregular with S1-S2 and no murmurs appreciated, lungs are clear to auscultation with no wheezes Rales, abdominal exam is benign, she has no significant lower extremity edema, neurologic exam is grossly nonfocal skin is warm and dry. Pulses are equal and symmetrical in her upper and lower extremities.    EKG  Procedure date:  06/14/2009  Findings:      history fibrillation with rate 62 beats per minute, rare ectopy  noted. Right bundle branch block, left anterior fascicular block.  Impression & Recommendations:  Problem # 1:  ATRIAL FIBRILLATION (ICD-427.31) she does not feel well despite adequate rate control. We'll continue her on her current medications and schedule her for a cardioversion at [facility]. She has been therapeutic with INR greater than 2 throughout April.  If we are able to restore sinus rhythm, we will likely hold her diltiazem and continue her on metoprolol and digoxin.   The following medications were removed from the medication list:    Aspirin 81 Mg Tbec (Aspirin) .Marland Kitchen... Take one tablet by mouth daily Her updated medication list for this problem includes:    Metoprolol Tartrate 50 Mg Tabs (Metoprolol tartrate) .Marland Kitchen... Take 1  tablet by mouth twice a day    Aspirin 81 Mg Tbec (Aspirin) .Marland Kitchen... Take one tablet by mouth daily    Warfarin Sodium 5 Mg Tabs (Warfarin sodium) .Marland Kitchen... As directed by anticoagulation clinic    Digoxin 0.25 Mg Tabs (Digoxin) .Marland Kitchen... Take one tablet by mouth daily  Problem # 2:  UNSPECIFIED ESSENTIAL HYPERTENSION (ICD-401.9) blood pressure is well-controlled on today's visit and we've made no medication changes.  The following medications were removed from the medication list:    Aspirin 81 Mg Tbec (Aspirin) .Marland Kitchen... Take one tablet by mouth daily Her updated medication list for this problem includes:    Metoprolol Tartrate 50 Mg Tabs (Metoprolol tartrate) .Marland Kitchen... Take 1  tablet by mouth twice a day    Aspirin 81 Mg Tbec (Aspirin) .Marland Kitchen... Take one tablet by mouth daily    Diltiazem Hcl Er Beads 120 Mg Xr24h-cap (Diltiazem hcl er beads) .Marland Kitchen... Take one capsule by mouth daily    Furosemide 20 Mg Tabs (Furosemide) .Marland Kitchen... Take one tablet by mouth daily    Ramipril 2.5 Mg Caps (Ramipril) .Marland Kitchen... Take one capsule by mouth daily  Appended Document: Orders Update    Clinical Lists Changes  Orders: Added new Referral order of Cardioversion (Cardioversion) - Signed

## 2010-03-14 NOTE — Progress Notes (Signed)
Summary: [facility]   Imported By: Harlon Flor 05/10/2009 08:42:04  _____________________________________________________________________  External Attachment:    Type:   Image     Comment:   External Document

## 2010-03-14 NOTE — Assessment & Plan Note (Signed)
Summary: EPH/AMD   Visit Type:  post hospital Referring Provider:  Yates Decamp, MD Primary Provider:  Yates Decamp, MD  CC:  some pain between shoulder blades nothing new. no cp and no sob - but haven't done anything to make her sob. Feeling tired a lot. no edema in ankles or feet. ...  History of Present Illness: 75 year old woman, patient of Dr. Dan Humphreys, presents for followup after her recent admission to the hospital for a total fibrillation, associated with symptoms of flushing, shortness of breath.  Since her discharge, she states that she has felt much better. She denies any significant symptoms of flushing or shortness of breath. She perhaps recalls some very short episodes of these were very mild. She continues to have episodes of discomfort in her back which has been a chronic issue over several years. Typically this comes after she has been standing for long period of time such as cooking in the kitchen.  she has felt more fatigued than usual. She has not been pushing herself at home. Blood pressures at home have typically been in the 120 to 1:30 range systolic, heart rates per her report had been in the 50 range sometimes into the 40s.  EKG in the office shows atrial fibrillation with rate 92 beats per minute.  Echocardiogram March 27 shows mild to moderately depressed EF, ejection fraction 40%, this was noted while in nature fibrillation. Mild-to-moderate mitral regurgitation, mildly elevated right ventricular systolic pressure. This was prior to her diuresis and rhythm control at [facility].  Current Problems (verified): 1)  Other and Unspecified Hyperlipidemia  (ICD-272.4) 2)  Unspecified Essential Hypertension  (ICD-401.9) 3)  Mitral Valve Prolapse  (ICD-424.0) 4)  Shortness of Breath  (ICD-786.05) 5)  Other Chest Pain  (ICD-786.59) 6)  Atrial Fibrillation  (ICD-427.31)  Current Medications (verified): 1)  Protonix 40 Mg Tbec (Pantoprazole Sodium) .Marland Kitchen.. 1po Once Daily 2)   Metoprolol Tartrate 50 Mg Tabs (Metoprolol Tartrate) .... Take One Tablet By Mouth Twice A Day 3)  Fish Oil 1000 Mg Caps (Omega-3 Fatty Acids) .Marland Kitchen.. 1 By Mouth Two Times A Day 4)  Calcium Carbonate 600 Mg Tabs (Calcium Carbonate) .Marland Kitchen.. 1 By Mouth Once Daily 5)  Vitamin C Cr 500 Mg Cr-Tabs (Ascorbic Acid) .Marland Kitchen.. 1 By Mouth Once Daily 6)  Vitamin D 1000 Unit Tabs (Cholecalciferol) .Marland Kitchen.. 1 By Mouth Once Daily 7)  Vitamin E 1000 Unit Caps (Vitamin E) .Marland Kitchen.. 1 By Mouth Once Daily 8)  Aspirin 81 Mg Tbec (Aspirin) .... Take One Tablet By Mouth Daily 9)  Diltiazem Hcl Er Beads 120 Mg Xr24h-Cap (Diltiazem Hcl Er Beads) .... Take One Capsule By Mouth Daily 10)  Warfarin Sodium 5 Mg Tabs (Warfarin Sodium) .... As Directed By Anticoagulation Clinic 11)  Furosemide 20 Mg Tabs (Furosemide) .... Take One Tablet By Mouth Two Times A Day 12)  Potassium Chloride Crys Cr 20 Meq Cr-Tabs (Potassium Chloride Crys Cr) .... Take One Tablet By Mouth Daily 13)  Aspirin 81 Mg Tbec (Aspirin) .... Take One Tablet By Mouth Daily 14)  Ramipril 2.5 Mg Caps (Ramipril) .... Take One Capsule By Mouth Daily 15)  Digoxin 0.25 Mg Tabs (Digoxin) .... Take One Tablet By Mouth Daily  Allergies (verified): 1)  ! Amoxicillin  Past History:  Past Medical History: Last updated: 06/01/09 Atrial Fibrillation G E R D Hyperlipidemia Hypertension Mitral Valve Prolapse  Past Surgical History: Last updated: 06-01-09 Abdominal Hysterectomy-Total Colon cancer surgery toe sew back on  Family History: Last updated: 2009/06/01 Father: deceased  86: lots health problems, CABG Mother: deceased 73: thyroid cancer Sister: deceased 50: pancreatic cancer Brother: living: 2 healthy  Social History: Last updated: 05/04/2009 Retired  Married  Tobacco Use - No.  Alcohol Use - no Regular Exercise - yes Drug Use - no  Risk Factors: Alcohol Use: 0 (05/04/2009) Caffeine Use: 1.5 cup (05/04/2009) Exercise: yes (05/04/2009)  Risk  Factors: Smoking Status: never (05/04/2009)  Review of Systems  The patient denies fever, weight loss, weight gain, vision loss, decreased hearing, hoarseness, chest pain, syncope, dyspnea on exertion, peripheral edema, prolonged cough, abdominal pain, incontinence, muscle weakness, depression, and enlarged lymph nodes.         fatigue  Vital Signs:  Patient profile:   75 year old female Height:      63 inches Weight:      167.50 pounds BMI:     29.78 Pulse rate:   46 / minute Pulse rhythm:   regular BP sitting:   129 / 77  (left arm) Cuff size:   regular  Vitals Entered By: Mercer Pod (May 17, 2009 3:11 PM)  Physical Exam  General:   in no apparent distress, HEENT exam is benign, oropharynx is clear, neck is supple with no JVP or carotid bruits, heart sounds are irregular with S1-S2 and no murmurs appreciated, lungs are clear to auscultation with no wheezes or rales, abdominal exam is benign, no significant lower extremity edema, neurologic exam is grossly nonfocal, skin is warm and dry, pulses are equal and symmetrical in her upper and lower extremities.    EKG  Procedure date:  05/17/2009  Findings:      H. fibrillation with a rate of 92 beats per minute, bigeminal pattern with PVCs. Left anterior fascicular block.  Impression & Recommendations:  Problem # 1:  ATRIAL FIBRILLATION (ICD-427.31) initial evaluation suggested bradycardia by her numbers as well as our heart rate monitor in the clinic. I have suggested that she cut her metoprolol and 2 half, 25 mg b.i.d. for bradycardia. I thought that this might help her fatigue.  After examination, her heart was noted to be irregular and I had the office staff do an EKG that she had left the office at the time that I was able to evaluate her EKG. This clearly showed H. with fibrillation with a rate in the low 90s. Frequent PVCs.  I suspect that her fatigue is due predominantly to her underlying atrial fibrillation  and not due to bradycardia. The monitor that she has, that we have in the office is likely not counting her PVCs.  Given this, I tried to contact her tonight to tell her to stay on her full dose of metoprolol. Without the metoprolol, her heart rate would likely go even higher. I will have her see Dr. Graciela Husbands or Dr. Ladona Ridgel in the office in the next several weeks to determine best course of management. As she is currently on warfarin, we could possibly start her on amiodarone in an effort to convert her to normal sinus rhythm. In the hospital, she was clearly paroxysmal and converted to normal sinus with better rate control.  She does not appear to be in heart failure like she was in the hospital and I will decrease her Lasix to one dose a day rather than b.i.d.  Her updated medication list for this problem includes:    Metoprolol Tartrate 50 Mg Tabs (Metoprolol tartrate) .Marland Kitchen... Take 1/2 tablet by mouth twice a day    Aspirin 81 Mg Tbec (  Aspirin) .Marland Kitchen... Take one tablet by mouth daily    Warfarin Sodium 5 Mg Tabs (Warfarin sodium) .Marland Kitchen... As directed by anticoagulation clinic    Digoxin 0.25 Mg Tabs (Digoxin) .Marland Kitchen... Take one tablet by mouth daily  Patient Instructions: 1)  Your physician recommends that you schedule a follow-up appointment in: 1 month 2)  Your physician has recommended you make the following change in your medication: decrease metoprolol to 25 mg twice a day, decrease lasix to once a day  Appended Document: EPH/AMD Scheduled to see Dr. Graciela Husbands on 26th

## 2010-03-14 NOTE — Op Note (Signed)
Summary: Consent for Electrical Cardioversion  Consent for Electrical Cardioversion   Imported By: Harlon Flor 07/27/2009 12:03:32  _____________________________________________________________________  External Attachment:    Type:   Image     Comment:   External Document

## 2010-03-14 NOTE — Medication Information (Signed)
Summary: Coumadin Clinic  Anticoagulant Therapy  Managed by: Charlena Cross, RN, BSN Referring MD: Mariah Milling PCP: Yates Decamp, MD Supervising MD: Mariah Milling Indication 1: Atrial Fibrillation Lab Used: [facility] [place] Site: [place] INR POC 3.6 INR RANGE 2.0-3.0  Dietary changes: no    Health status changes: no    Bleeding/hemorrhagic complications: no    Recent/future hospitalizations: no    Any changes in medication regimen? no    Recent/future dental: no  Any missed doses?: no       Is patient compliant with meds? yes       Allergies: 1)  ! Amoxicillin  Anticoagulation Management History:      The patient is taking warfarin and comes in today for a routine follow up visit.  Positive risk factors for bleeding include an age of 73 years or older.  The bleeding index is 'intermediate risk'.  Positive CHADS2 values include History of HTN.  Negative CHADS2 values include Age > 57 years old.  Anticoagulation responsible provider: Ashaz Robling.  INR POC: 3.6.    Anticoagulation Management Assessment/Plan:      The patient's current anticoagulation dose is Warfarin sodium 5 mg tabs: as directed by anticoagulation clinic.  The next INR is due 05/24/2009.  Anticoagulation instructions were given to patient.  Results were reviewed/authorized by Charlena Cross, RN, BSN.  She was notified by Charlena Cross, RN, BSN.         Current Anticoagulation Instructions: no coumadin today then coumadin 5 mg daily with 2.5 mg on Tues and Thurs.

## 2010-03-14 NOTE — Progress Notes (Signed)
Summary: PHI  PHI   Imported By: Harlon Flor 05/05/2009 08:49:31  _____________________________________________________________________  External Attachment:    Type:   Image     Comment:   External Document

## 2010-03-14 NOTE — Assessment & Plan Note (Signed)
Summary: [facility]   Visit Type:  New Patient Referring Provider:  Yates Decamp, MD Primary Provider:  Yates Decamp, MD  CC:  no onset of a-fib, come during the early morning, normally don't last long maybe 2 hours at the most, and but this am this feeling has lasted much longer. This morning she is having pain in the back. Shortness of breath just started. No edema in ankles and feet. Marland Kitchen  History of Present Illness: 75 year old woman, patient of Dr. Dan Humphreys, presents with more than one month of sweats, chest discomfort, some shortness of breath and fatigue.  Ms. Biskup states that she is typically very active, works in her garden and has no symptoms. For more than the past month, she has had worsening symptoms of sweating. She has waves of diaphoresis and she describes it as burning up at nighttime when she is sleeping. She denies any palpitations, significant shortness of breath other than when she is bending over. She did have an episode this morning of some worsening shortness of breath and some chest discomfort.  She has had chronic back pain and states that it is worse when she sits in certain positions or when she stands for long periods of time. She does have some episodes of chest discomfort at nighttime and this sometimes is resolved by belching.  She has an echocardiogram from last year and a history of mitral valve prolapse though this is unavailable to [place] at this time. She was taken off her atenolol last week 50 mg daily and started on metoprolol 25 mg daily but states that since this change was made she does not feel as well.  Preventive Screening-Counseling & Management  Alcohol-Tobacco     Alcohol drinks/day: 0     Smoking Status: never  Caffeine-Diet-Exercise     Caffeine use/day: 1.5 cup     Does Patient Exercise: yes      Drug Use:  no.    Current Problems (verified): 1)  Other and Unspecified Hyperlipidemia  (ICD-272.4) 2)  Unspecified Essential Hypertension   (ICD-401.9) 3)  Mitral Valve Prolapse  (ICD-424.0) 4)  Shortness of Breath  (ICD-786.05) 5)  Other Chest Pain  (ICD-786.59) 6)  Atrial Fibrillation  (ICD-427.31)  Current Medications (verified): 1)  Protonix 40 Mg Tbec (Pantoprazole Sodium) .Marland Kitchen.. 1po Once Daily 2)  Metoprolol Succinate 25 Mg Xr24h-Tab (Metoprolol Succinate) .... Take One Tablet By Mouth Daily 3)  Fish Oil 1000 Mg Caps (Omega-3 Fatty Acids) .Marland Kitchen.. 1 By Mouth Two Times A Day 4)  Calcium Carbonate 600 Mg Tabs (Calcium Carbonate) .Marland Kitchen.. 1 By Mouth Once Daily 5)  Vitamin C Cr 500 Mg Cr-Tabs (Ascorbic Acid) .Marland Kitchen.. 1 By Mouth Once Daily 6)  Vitamin D 1000 Unit Tabs (Cholecalciferol) .Marland Kitchen.. 1 By Mouth Once Daily 7)  Vitamin E 1000 Unit Caps (Vitamin E) .Marland Kitchen.. 1 By Mouth Once Daily 8)  Aspirin 81 Mg Tbec (Aspirin) .... Take One Tablet By Mouth Daily  Allergies (verified): 1)  ! Amoxicillin  Past History:  Past Medical History: Atrial Fibrillation G E R D Hyperlipidemia Hypertension Mitral Valve Prolapse  Past Surgical History: Abdominal Hysterectomy-Total Colon cancer surgery toe sew back on  Family History: Father: deceased 59: lots health problems, CABG Mother: deceased 94: thyroid cancer Sister: deceased 40: pancreatic cancer Brother: living: 2 healthy  Social History: Retired  Married  Tobacco Use - No.  Alcohol Use - no Regular Exercise - yes Drug Use - no Alcohol drinks/day:  0 Smoking Status:  never Caffeine  use/day:  1.5 cup Does Patient Exercise:  yes Drug Use:  no  Review of Systems  The patient denies fever, weight loss, weight gain, vision loss, decreased hearing, hoarseness, chest pain, syncope, dyspnea on exertion, peripheral edema, prolonged cough, abdominal pain, incontinence, muscle weakness, depression, and enlarged lymph nodes.         sweating, some SOB/chest pain  Vital Signs:  Patient profile:   75 year old female Height:      63 inches Weight:      172.25 pounds BMI:     30.62 Pulse  rate:   116 / minute Pulse rhythm:   irregular BP sitting:   150 / 110  (left arm) Cuff size:   large  Vitals Entered By: Mercer Pod (May 04, 2009 10:52 AM)  Physical Exam  General:  well-appearing woman in no apparent distress, she appears to be uncomfortable sitting on the exam room table due to back pain in the right midthoracic region. She states this is chronic. HEENT exam is benign neck is supple with no JVP, no carotid bruits, heart sounds are rapid and irregular with no murmurs appreciated, lungs are clear to auscultation with no wheezes or rales, abdominal exam is benign, no significant lower extremity edema, neurologic exam is grossly nonfocal, skin is warm and dry. Pulses are equal and symmetrical in her upper and lower extremities.    EKG  Procedure date:  05/04/2009  Findings:      EKG shows atrial fibrillation with rate of 116 beats per minute, left anterior fascicular block  Previous EKG from last week from Dr. Tilman Neat office also shows atrial fibrillation with rate of 82 beats per minute left anterior fascicular block.  Impression & Recommendations:  Problem # 1:  ATRIAL FIBRILLATION (ICD-427.31) Ms. Amero has atrial fibrillation. It is uncertain how long she has been in this rhythm. Her rate is elevated today and appeared to be improved on the atenolol. She does have 3 months of atenolol at home and we will change her back to atenolol 50 mg daily. We will also start diltiazem CD 120 mg daily. we will start her on warfarin and have her come back to the clinic in one week for a Coumadin check and for an evaluation and EKG to evaluate her heart rate. If her rate continues to be elevated, we could advance her diltiazem or add digoxin.  Her updated medication list for this problem includes:    Atenolol 50 Mg Tabs (Atenolol) .Marland Kitchen... Take one tablet by mouth daily    Aspirin 81 Mg Tbec (Aspirin) .Marland Kitchen... Take one tablet by mouth daily    Warfarin Sodium 5 Mg Tabs  (Warfarin sodium) .Marland Kitchen... As directed by anticoagulation clinic  Problem # 2:  MITRAL VALVE PROLAPSE (ICD-424.0) we'll try to obtain her most recent echocardiogram before or during any new studies.  Her updated medication list for this problem includes:    Atenolol 50 Mg Tabs (Atenolol) .Marland Kitchen... Take one tablet by mouth daily  Problem # 3:  UNSPECIFIED ESSENTIAL HYPERTENSION (ICD-401.9) Blood pressure is elevated say we will add diltiazem 120 mg for rate control as well as for her blood pressure.  Her updated medication list for this problem includes:    Atenolol 50 Mg Tabs (Atenolol) .Marland Kitchen... Take one tablet by mouth daily    Aspirin 81 Mg Tbec (Aspirin) .Marland Kitchen... Take one tablet by mouth daily    Diltiazem Hcl Er Beads 120 Mg Xr24h-cap (Diltiazem hcl er beads) .Marland Kitchen... Take one capsule by  mouth daily  Patient Instructions: 1)  Your physician recommends that you schedule a follow-up appointment in: 1 week 2)  Your physician has recommended you make the following change in your medication: change metoprolol to atenolol 50 mg daily, start diltiazem 120 mg daily, start warfarin 5 mg daily 3)  Your physician recommends that you return for lab work in: 1 week with office visit for coumadin check Prescriptions: WARFARIN SODIUM 5 MG TABS (WARFARIN SODIUM) as directed by anticoagulation clinic  #30 x 2   Entered by:   Charlena Cross, RN, BSN   Authorized by:   Dossie Arbour MD   Signed by:   Charlena Cross, RN, BSN on 05/04/2009   Method used:   Electronically to        [facility] [address] (retail)       [address], [address]       [place], [place]  [address]       Ph: [phone]       Fax: [phone]   RxID:   [id number] DILTIAZEM HCL ER BEADS 120 MG XR24H-CAP (DILTIAZEM HCL ER BEADS) Take one capsule by mouth daily  #30 x 3   Entered by:   Charlena Cross, RN, BSN   Authorized by:   Dossie Arbour MD   Signed by:   Charlena Cross, RN, BSN on  05/04/2009   Method used:   Electronically to        [facility] [address] (retail)       [address], [address]       [place], [place]  [address]       Ph: [phone]       Fax: [phone]   RxID:   [id number]

## 2010-03-14 NOTE — Medication Information (Signed)
Summary: rov/ewj  Anticoagulant Therapy  Managed by: Cloyde Reams, RN, BSN Referring MD: Mariah Milling PCP: Yates Decamp, MD Supervising MD: Shirlee Latch MD, [place] Indication 1: Atrial Fibrillation Lab Used: [facility] [place] Site: [place] INR POC 2.4 INR RANGE 2.0-3.0  Dietary changes: no    Health status changes: no    Bleeding/hemorrhagic complications: no    Recent/future hospitalizations: no    Any changes in medication regimen? no    Recent/future dental: no  Any missed doses?: no       Is patient compliant with meds? yes       Allergies: 1)  ! Amoxicillin  Anticoagulation Management History:      The patient is taking warfarin and comes in today for a routine follow up visit.  Positive risk factors for bleeding include an age of 18 years or older.  The bleeding index is 'intermediate risk'.  Positive CHADS2 values include History of HTN.  Negative CHADS2 values include Age > 56 years old.  Her last INR was 2.37.  Anticoagulation responsible provider: Shirlee Latch MD, [place].  INR POC: 2.4.  Cuvette Lot#: [id number].  Exp: 11/2010.    Anticoagulation Management Assessment/Plan:      The patient's current anticoagulation dose is Warfarin sodium 5 mg tabs: as directed by anticoagulation clinic.  The target INR is 2.0-3.0.  The next INR is due 11/09/2009.  Anticoagulation instructions were given to patient.  Results were reviewed/authorized by Cloyde Reams, RN, BSN.  She was notified by Cloyde Reams RN.         Prior Anticoagulation Instructions: INR 2.3  Continue on same dosage 1 tablet daily.  Recheck in 4 weeks.    Current Anticoagulation Instructions: INR 2.4  Continue on same dosage 1 tablet daily.  Recheck in 4 weeks.

## 2010-03-14 NOTE — Medication Information (Signed)
Summary: CCR/NE  Anticoagulant Therapy  Managed by: Cloyde Reams, RN, BSN Referring MD: Mariah Milling PCP: Yates Decamp, MD Supervising MD: Mariah Milling Indication 1: Atrial Fibrillation Lab Used: [facility] [place] Site:  INR POC 2.4 INR RANGE 2.0-3.0  Dietary changes: no    Health status changes: no    Bleeding/hemorrhagic complications: yes       Details: BRB x 1 episode when wiped after having 3 BM's during that day.   Recent/future hospitalizations: no    Any changes in medication regimen? no    Recent/future dental: no  Any missed doses?: no       Is patient compliant with meds? yes       Allergies: 1)  ! Amoxicillin  Anticoagulation Management History:      The patient is taking warfarin and comes in today for a routine follow up visit.  Positive risk factors for bleeding include an age of 73 years or older.  The bleeding index is 'intermediate risk'.  Positive CHADS2 values include History of HTN.  Negative CHADS2 values include Age > 20 years old.  Her last INR was 2.37.  Anticoagulation responsible provider: Evita Merida.  INR POC: 2.4.  Cuvette Lot#: [id number].  Exp: 10/2010.    Anticoagulation Management Assessment/Plan:      The patient's current anticoagulation dose is Warfarin sodium 5 mg tabs: as directed by anticoagulation clinic.  The target INR is 2.0-3.0.  The next INR is due 09/14/2009.  Anticoagulation instructions were given to patient.  Results were reviewed/authorized by Cloyde Reams, RN, BSN.  She was notified by Cloyde Reams RN.         Prior Anticoagulation Instructions: INR 2.3  Continue same dose of 1 tab daily and recheck in 2 weeks.   Current Anticoagulation Instructions: INR 2.4  Continue on same dosage 1 tablet daily.  Recheck in 3 weeks.

## 2010-03-14 NOTE — Letter (Signed)
Summary: Cardioversion/TEE Catering manager at [address]   [place], [place] [address]   Phone: [phone]  Fax: [phone]    Cardioversion / TEE Cardioversion Instructions  You are scheduled for a Cardioversion / TEE Cardioversion on Jun 21, 2009 with Dr. Mariah Milling.   Please arrive at the [facility] at 6:30 a.m. on the day of your procedure.  1)   DIET:  A)   Nothing to eat or drink after midnight except your medications with a sip of water.  2)   MAKE SURE YOU TAKE YOUR COUMADIN.  3)   A)   DO NOT TAKE these medications before your procedure:      Furosemide  4)  Must have a responsible person to drive you home.  5)   Bring a current list of your medications and current insurance cards.   * Special Note:  Every effort is made to have your procedure done on time. Occasionally there are emergencies that present themselves at the hospital that may cause delays. Please be patient if a delay does occur.  * If you have any questions after you get home, please call the office at [phone].

## 2010-03-14 NOTE — Medication Information (Signed)
Summary: CCR/AMD  Anticoagulant Therapy  Managed by: Cloyde Reams, RN, BSN Referring MD: Mariah Milling PCP: Yates Decamp, MD Supervising MD: Mariah Milling Indication 1: Atrial Fibrillation Lab Used: [facility] [place] Site:  INR POC 1.5 INR RANGE 2.0-3.0  Dietary changes: yes       Details: Decr vit K intake  Health status changes: no    Bleeding/hemorrhagic complications: no    Recent/future hospitalizations: no    Any changes in medication regimen? no    Recent/future dental: no  Any missed doses?: no       Is patient compliant with meds? yes       Allergies: 1)  ! Amoxicillin  Anticoagulation Management History:      The patient is taking warfarin and comes in today for a routine follow up visit.  Positive risk factors for bleeding include an age of 73 years or older.  The bleeding index is 'intermediate risk'.  Positive CHADS2 values include History of HTN.  Negative CHADS2 values include Age > 34 years old.  Her last INR was 2.37.  Anticoagulation responsible provider: Yasmeen Manka.  INR POC: 1.5.  Exp: 09/2010.    Anticoagulation Management Assessment/Plan:      The patient's current anticoagulation dose is Warfarin sodium 5 mg tabs: as directed by anticoagulation clinic.  The target INR is 2.0-3.0.  The next INR is due 08/03/2009.  Anticoagulation instructions were given to patient.  Results were reviewed/authorized by Cloyde Reams, RN, BSN.  She was notified by Benedict Needy, RN.         Prior Anticoagulation Instructions: INR 2.3  Continue on same dosage 5mg  daily except 2.5mg  on Tuesdays, Thursdays, and Saturdays.  Recheck in 4 weeks.     Current Anticoagulation Instructions: INR 1.5   Take 1.5 tablets today, then start taking 1 tablet daily except 1/2 tablet on Tuesdays and Saturdays.  Recheck in 1 week.

## 2010-03-16 NOTE — Medication Information (Signed)
Summary: rov/tm  Anticoagulant Therapy  Managed by: Cloyde Reams, RN, BSN Referring MD: Mariah Milling PCP: Yates Decamp, MD Supervising MD: Mariah Milling MD, TIM  Indication 1: Atrial Fibrillation Lab Used: [facility] [place] Site: [place] INR POC 1.6 INR RANGE 2.0-3.0  Dietary changes: no    Health status changes: yes       Details: Just not feeling well, saw Dr Mariah Milling.   Bleeding/hemorrhagic complications: no    Recent/future hospitalizations: no    Any changes in medication regimen? no    Recent/future dental: no  Any missed doses?: no       Is patient compliant with meds? yes       Allergies: 1)  ! Amoxicillin  Anticoagulation Management History:      The patient is taking warfarin and comes in today for a routine follow up visit.  Positive risk factors for bleeding include an age of 40 years or older.  The bleeding index is 'intermediate risk'.  Positive CHADS2 values include History of HTN.  Negative CHADS2 values include Age > 35 years old.  Her last INR was 2.37.  Anticoagulation responsible provider: Mariah Milling MD, TIM .  INR POC: 1.6.  Cuvette Lot#: [id number].  Exp: 01/2011.    Anticoagulation Management Assessment/Plan:      The patient's current anticoagulation dose is Warfarin sodium 5 mg tabs: as directed by anticoagulation clinic.  The target INR is 2.0-3.0.  The next INR is due 02/15/2010.  Anticoagulation instructions were given to patient.  Results were reviewed/authorized by Cloyde Reams, RN, BSN.  She was notified by Cloyde Reams RN.         Prior Anticoagulation Instructions: INR 2.0 Today take extra 2.5mg  then Continue 5mg s daily. Recheck in 4 weeks.   Current Anticoagulation Instructions: INR 1.6  Take 1.5 tablets today and tomorrow, then start taking 1 tablet daily except 1.5 tablets on Wednesdays.  Recheck in 2 weeks.

## 2010-03-16 NOTE — Assessment & Plan Note (Signed)
Summary: F/U 6 MONTHS/NE    Visit Type:  Follow-up Referring Provider:  Yates Decamp, MD Primary Provider:  Yates Decamp, MD  CC:  c/o chest pain, arm pain, and nausea and headache this morning.  History of Present Illness: 75 year old woman, patient of Dr. Dan Humphreys, presents for followup for atrial fibrillation. She has episodes of chronic chest pain, arm pain that is atypical in nature and comes on at rest. She has a stress test several years ago at [place] that she reports was normal.  overall she has been doing well though reports when she woke up today, she has not felt well. She has malaise, chest discomfort radiating to her back, left arm pain, nausea. Typically if she burps or has a bowel movement, her symptoms are relieved. She's had 2 bowel movements today and has been trying to belch with no relief of her symptoms.  Echocardiogram March 27 shows mild to moderately depressed EF, ejection fraction 40%, this was noted while in atrial fibrillation. Mild-to-moderate mitral regurgitation, mildly elevated right ventricular systolic pressure. This was prior to her diuresis and rhythm control at [facility].  EKG today shows atrial fibrillation with ventricular rate90 beats per minute with rare PVC  Current Medications (verified): 1)  Metoprolol Tartrate 50 Mg Tabs (Metoprolol Tartrate) .... 1/2 By Mouth Two Times A Day 2)  Fish Oil 1000 Mg Caps (Omega-3 Fatty Acids) .Marland Kitchen.. 1 By Mouth Two Times A Day 3)  Calcium Carbonate 600 Mg Tabs (Calcium Carbonate) .Marland Kitchen.. 1 By Mouth Once Daily 4)  Vitamin C Cr 500 Mg Cr-Tabs (Ascorbic Acid) .Marland Kitchen.. 1 By Mouth Once Daily 5)  Vitamin D 1000 Unit Tabs (Cholecalciferol) .Marland Kitchen.. 1 By Mouth Once Daily As Needed 6)  Vitamin E 1000 Unit Caps (Vitamin E) .Marland Kitchen.. 1 By Mouth Once Daily 7)  Aspirin 81 Mg Tbec (Aspirin) .... Take One Tablet By Mouth Daily 8)  Warfarin Sodium 5 Mg Tabs (Warfarin Sodium) .... As Directed By Anticoagulation Clinic 9)  Furosemide 20 Mg Tabs (Furosemide)  .... Take One Tablet By Mouth Daily 10)  Potassium Chloride Crys Cr 20 Meq Cr-Tabs (Potassium Chloride Crys Cr) .... Take One Tablet By Mouth Daily 11)  Ramipril 2.5 Mg Caps (Ramipril) .... Take One Capsule By Mouth Daily 12)  Amlodipine Besylate 10 Mg Tabs (Amlodipine Besylate) .... Take One Tablet By Mouth Daily 13)  Pantoprazole Sodium 40 Mg Tbec (Pantoprazole Sodium) .Marland Kitchen.. 1 Tablet Daily  Allergies (verified): 1)  ! Amoxicillin  Past History:  Past Medical History: Last updated: 05/09/09 Atrial Fibrillation [facility] Hyperlipidemia Hypertension Mitral Valve Prolapse  Past Surgical History: Last updated: May 09, 2009 Abdominal Hysterectomy-Total Colon cancer surgery toe sew back on  Family History: Last updated: 05/09/2009 Father: deceased 45: lots health problems, CABG Mother: deceased 4: thyroid cancer Sister: deceased 19: pancreatic cancer Brother: living: 2 healthy  Social History: Last updated: 05/09/09 Retired  Married  Tobacco Use - No.  Alcohol Use - no Regular Exercise - yes Drug Use - no  Risk Factors: Alcohol Use: 0 (2009/05/09) Caffeine Use: 1.5 cup (05-09-09) Exercise: yes (May 09, 2009)  Risk Factors: Smoking Status: never (05-09-09)  Review of Systems       The patient complains of chest pain.  The patient denies fever, weight loss, weight gain, vision loss, decreased hearing, hoarseness, syncope, dyspnea on exertion, peripheral edema, prolonged cough, abdominal pain, incontinence, muscle weakness, depression, and enlarged lymph nodes.         malaise  Vital Signs:  Patient profile:   74  year old female Height:      63 inches Weight:      163.75 pounds BMI:     29.11 Pulse rate:   90 / minute BP sitting:   132 / 78  (left arm) Cuff size:   regular  Vitals Entered By: Lysbeth Galas CMA (February 01, 2010 10:26 AM)  Physical Exam  General:  Well developed, well nourished, in no acute distress. Head:  normocephalic and  atraumatic Neck:  Neck supple, no JVD. No masses, thyromegaly or abnormal cervical nodes. Lungs:  Clear bilaterally to auscultation and percussion. Heart:  Non-displaced PMI, chest non-tender; irregular rate and rhythm, S1, S2 with I-II/VI SEM  murmurs, no rubs or gallops. Carotid upstroke normal, no bruit. . Pedals normal pulses. No edema, no varicosities. Abdomen:  Bowel sounds positive; abdomen soft and non-tender without masses Msk:  Back normal, normal gait. Muscle strength and tone normal. Pulses:  pulses normal in all 4 extremities Extremities:  No clubbing or cyanosis. Neurologic:  Alert and oriented x 3. Skin:  Intact without lesions or rashes. Psych:  Normal affect.   Impression & Recommendations:  Problem # 1:  OTHER CHEST PAIN (ICD-786.59) chest pain symptoms are atypical. They come on at rest, at nighttime. They typically feel better with exertion. She's had previous workup with a stress test. She is not interested in any further workup at this time. We have suggested she could try a stool softener, Pepcid in addition to her PPI. she did not want to try nitroglycerin as she has a headache.  Her updated medication list for this problem includes:    Metoprolol Tartrate 50 Mg Tabs (Metoprolol tartrate) .Marland Kitchen... 1/2 by mouth two times a day    Aspirin 81 Mg Tbec (Aspirin) .Marland Kitchen... Take one tablet by mouth daily    Warfarin Sodium 5 Mg Tabs (Warfarin sodium) .Marland Kitchen... As directed by anticoagulation clinic    Ramipril 2.5 Mg Caps (Ramipril) .Marland Kitchen... Take one capsule by mouth daily    Amlodipine Besylate 5 Mg Tabs (Amlodipine besylate) .Marland Kitchen... Take one tablet by mouth daily  Problem # 2:  ATRIAL FIBRILLATION (ICD-427.31)  Chronic atrial fibrillation. She is asymptomatic. No further discussion about cardioversion.  Her updated medication list for this problem includes:    Metoprolol Tartrate 50 Mg Tabs (Metoprolol tartrate) .Marland Kitchen... 1/2 by mouth two times a day    Aspirin 81 Mg Tbec (Aspirin)  .Marland Kitchen... Take one tablet by mouth daily    Warfarin Sodium 5 Mg Tabs (Warfarin sodium) .Marland Kitchen... As directed by anticoagulation clinic  Her updated medication list for this problem includes:    Metoprolol Tartrate 50 Mg Tabs (Metoprolol tartrate) .Marland Kitchen... 1/2 by mouth two times a day    Aspirin 81 Mg Tbec (Aspirin) .Marland Kitchen... Take one tablet by mouth daily    Warfarin Sodium 5 Mg Tabs (Warfarin sodium) .Marland Kitchen... As directed by anticoagulation clinic  Problem # 3:  OTHER AND UNSPECIFIED HYPERLIPIDEMIA (ICD-272.4) Encouraged weight loss, diet, exercise. Continue aspirin. Nonsmoker.  Problem # 4:  HYPERTENSION, BENIGN (ICD-401.1) blood pressure is borderline low at home. We will decrease her amlodipine to 5 mg daily. She has waves of malaise and I suspect this could be secondary to hypotension. She is unable to measure her blood pressure during these episodes.  Her updated medication list for this problem includes:    Metoprolol Tartrate 50 Mg Tabs (Metoprolol tartrate) .Marland Kitchen... 1/2 by mouth two times a day    Aspirin 81 Mg Tbec (Aspirin) .Marland Kitchen... Take one tablet by  mouth daily    Furosemide 20 Mg Tabs (Furosemide) .Marland Kitchen... Take one tablet by mouth daily    Ramipril 2.5 Mg Caps (Ramipril) .Marland Kitchen... Take one capsule by mouth daily    Amlodipine Besylate 5 Mg Tabs (Amlodipine besylate) .Marland Kitchen... Take one tablet by mouth daily  Patient Instructions: 1)  Your physician recommends that you schedule a follow-up appointment in: 6 months 2)  Your physician has recommended you make the following change in your medication: DECREASE Amlodipine 5mg  once daily. 3)  Your physician has requested that you regularly monitor and record your blood pressure readings at home.  Please use the same machine at the same time of day to check your readings and record them to bring to your follow-up visit. Prescriptions: AMLODIPINE BESYLATE 5 MG TABS (AMLODIPINE BESYLATE) Take one tablet by mouth daily  #30 x 6   Entered by:   Lanny Hurst RN    Authorized by:   Dossie Arbour MD   Signed by:   Lanny Hurst RN on 02/01/2010   Method used:   Electronically to        [id number] * (retail)       [address]       [place], [place]  [address]       Ph: [phone] or [phone]       Fax: [phone]   RxID:   [id number]

## 2010-03-16 NOTE — Medication Information (Signed)
Summary: Brenda Gonzalez  Anticoagulant Therapy  Managed by: Cloyde Reams, RN, BSN Referring MD: Mariah Milling PCP: Yates Decamp, MD Supervising MD: Shirlee Latch MD, Porchia Sinkler Indication 1: Atrial Fibrillation Lab Used: [facility] [place] Site: [place] INR POC 1.9 INR RANGE 2.0-3.0  Dietary changes: no    Health status changes: no    Bleeding/hemorrhagic complications: no    Recent/future hospitalizations: no    Any changes in medication regimen? yes       Details: Decreased Amlodipine to 5mg  daily.   Recent/future dental: no  Any missed doses?: no       Is patient compliant with meds? yes       Allergies: 1)  ! Amoxicillin  Anticoagulation Management History:      The patient is taking warfarin and comes in today for a routine follow up visit.  Positive risk factors for bleeding include an age of 3 years or older.  The bleeding index is 'intermediate risk'.  Positive CHADS2 values include History of HTN.  Negative CHADS2 values include Age > 79 years old.  Her last INR was 2.37.  Anticoagulation responsible provider: Shirlee Latch MD, Gershon Shorten.  INR POC: 1.9.  Cuvette Lot#: [id number].  Exp: 03/2011.    Anticoagulation Management Assessment/Plan:      The patient's current anticoagulation dose is Warfarin sodium 5 mg tabs: as directed by anticoagulation clinic.  The target INR is 2.0-3.0.  The next INR is due 03/08/2010.  Anticoagulation instructions were given to patient.  Results were reviewed/authorized by Cloyde Reams, RN, BSN.  She was notified by Cloyde Reams RN.         Prior Anticoagulation Instructions: INR 1.6  Take 1.5 tablets today and tomorrow, then start taking 1 tablet daily except 1.5 tablets on Wednesdays.  Recheck in 2 weeks.   Current Anticoagulation Instructions: INR 1.9  Take an extra 1/2 tablet today, then resume same dosage 1 tablet daily except 1.5 tablets on Wednesdays.  Recheck in 3 weeks.   Prescriptions: WARFARIN SODIUM 5 MG TABS (WARFARIN SODIUM) as  directed by anticoagulation clinic  #40 x 3   Entered by:   Cloyde Reams RN   Authorized by:   Dossie Arbour MD   Signed by:   Cloyde Reams RN on 02/15/2010   Method used:   [facility] to        [id number] (retail)       [address]       [place], [place]  [address]       Ph: [phone] or [phone]       Fax: [phone]   RxID:   [id number]

## 2010-03-16 NOTE — Medication Information (Signed)
Summary: rov/ewj  Anticoagulant Therapy  Managed by: Cloyde Reams, RN, BSN Referring MD: Mariah Milling PCP: Yates Decamp, MD Supervising MD: Gala Romney MD, Reuel Boom Indication 1: Atrial Fibrillation Lab Used: [facility] [place] Site: [place] INR POC 1.9 INR RANGE 2.0-3.0  Dietary changes: no    Health status changes: no    Bleeding/hemorrhagic complications: no    Recent/future hospitalizations: no    Any changes in medication regimen? no    Recent/future dental: no  Any missed doses?: no       Is patient compliant with meds? yes       Allergies: 1)  ! Amoxicillin  Anticoagulation Management History:      The patient is taking warfarin and comes in today for a routine follow up visit.  Positive risk factors for bleeding include an age of 73 years or older.  The bleeding index is 'intermediate risk'.  Positive CHADS2 values include History of HTN.  Negative CHADS2 values include Age > 41 years old.  Her last INR was 2.37.  Anticoagulation responsible provider: Bensimhon MD, Reuel Boom.  INR POC: 1.9.  Cuvette Lot#: [id number].  Exp: 03/2011.    Anticoagulation Management Assessment/Plan:      The patient's current anticoagulation dose is Warfarin sodium 5 mg tabs: as directed by anticoagulation clinic.  The target INR is 2.0-3.0.  The next INR is due 03/29/2010.  Anticoagulation instructions were given to patient.  Results were reviewed/authorized by Cloyde Reams, RN, BSN.  She was notified by Cloyde Reams RN.         Prior Anticoagulation Instructions: INR 1.9  Take an extra 1/2 tablet today, then resume same dosage 1 tablet daily except 1.5 tablets on Wednesdays.  Recheck in 3 weeks.    Current Anticoagulation Instructions: INR 1.9  Start taking 1 tablet daily except 1.5 tablets on Wednesdays and Saturdays.  Recheck in 3 weeks.

## 2010-03-29 ENCOUNTER — Encounter: Payer: Self-pay | Admitting: Cardiovascular Disease

## 2010-03-29 ENCOUNTER — Encounter (INDEPENDENT_AMBULATORY_CARE_PROVIDER_SITE_OTHER): Payer: Medicare Other

## 2010-03-29 DIAGNOSIS — Z7901 Long term (current) use of anticoagulants: Secondary | ICD-10-CM

## 2010-03-29 DIAGNOSIS — I4891 Unspecified atrial fibrillation: Secondary | ICD-10-CM

## 2010-04-05 NOTE — Medication Information (Signed)
Summary: ROV/AMD  Anticoagulant Therapy  Managed by: Bethena Midget, RN, BSN Referring MD: Mariah Milling PCP: Yates Decamp, MD Supervising MD: Mariah Milling  Indication 1: Atrial Fibrillation Lab Used: [facility] [place] Site: [place] INR POC 2.3 INR RANGE 2.0-3.0  Dietary changes: no    Health status changes: yes       Details: Generalized joint aching   Bleeding/hemorrhagic complications: no    Recent/future hospitalizations: no    Any changes in medication regimen? no    Recent/future dental: no  Any missed doses?: no       Is patient compliant with meds? yes       Allergies: 1)  ! Amoxicillin  Anticoagulation Management History:      The patient is taking warfarin and comes in today for a routine follow up visit.  Positive risk factors for bleeding include an age of 45 years or older.  The bleeding index is 'intermediate risk'.  Positive CHADS2 values include History of HTN.  Negative CHADS2 values include Age > 70 years old.  Her last INR was 2.37.  Anticoagulation responsible provider: [place] .  INR POC: 2.3.  Cuvette Lot#: [id number].  Exp: 02/2011.    Anticoagulation Management Assessment/Plan:      The patient's current anticoagulation dose is Warfarin sodium 5 mg tabs: as directed by anticoagulation clinic.  The target INR is 2.0-3.0.  The next INR is due 04/26/2010.  Anticoagulation instructions were given to patient.  Results were reviewed/authorized by Bethena Midget, RN, BSN.  She was notified by Bethena Midget, RN, BSN.         Prior Anticoagulation Instructions: INR 1.9  Start taking 1 tablet daily except 1.5 tablets on Wednesdays and Saturdays.  Recheck in 3 weeks.   Current Anticoagulation Instructions: INR 2.3 Continue 5mg s daily except 7.5mg s on Wednesdays and Saturdays. Recheck in 4 weeks.

## 2010-04-26 ENCOUNTER — Encounter: Payer: Self-pay | Admitting: Cardiovascular Disease

## 2010-04-26 ENCOUNTER — Encounter (INDEPENDENT_AMBULATORY_CARE_PROVIDER_SITE_OTHER): Payer: Medicare Other

## 2010-04-26 DIAGNOSIS — I4891 Unspecified atrial fibrillation: Secondary | ICD-10-CM

## 2010-04-26 DIAGNOSIS — Z7901 Long term (current) use of anticoagulants: Secondary | ICD-10-CM

## 2010-04-26 LAB — CONVERTED CEMR LAB: POC INR: 2.5

## 2010-05-02 ENCOUNTER — Encounter: Payer: Self-pay | Admitting: Cardiovascular Disease

## 2010-05-02 DIAGNOSIS — I4891 Unspecified atrial fibrillation: Secondary | ICD-10-CM

## 2010-05-02 DIAGNOSIS — Z7901 Long term (current) use of anticoagulants: Secondary | ICD-10-CM | POA: Insufficient documentation

## 2010-05-02 NOTE — Medication Information (Signed)
Summary: rov/tm  Anticoagulant Therapy  Managed by: Cloyde Reams, RN, BSN Referring MD: Mariah Milling PCP: Yates Decamp, MD Supervising MD: Mariah Milling  Indication 1: Atrial Fibrillation Lab Used: [facility] [place] Site: [place] INR POC 2.5 INR RANGE 2.0-3.0  Dietary changes: no    Health status changes: no    Bleeding/hemorrhagic complications: no    Recent/future hospitalizations: no    Any changes in medication regimen? no    Recent/future dental: no  Any missed doses?: no       Is patient compliant with meds? yes       Allergies: 1)  ! Amoxicillin  Anticoagulation Management History:      The patient is taking warfarin and comes in today for a routine follow up visit.  Positive risk factors for bleeding include an age of 3 years or older.  The bleeding index is 'intermediate risk'.  Positive CHADS2 values include History of HTN.  Negative CHADS2 values include Age > 48 years old.  Her last INR was 2.37.  Anticoagulation responsible provider: Alysse Rathe .  INR POC: 2.5.  Cuvette Lot#: [id number].  Exp: 02/2011.    Anticoagulation Management Assessment/Plan:      The patient's current anticoagulation dose is Warfarin sodium 5 mg tabs: as directed by anticoagulation clinic.  The target INR is 2.0-3.0.  The next INR is due 05/24/2010.  Anticoagulation instructions were given to patient.  Results were reviewed/authorized by Cloyde Reams, RN, BSN.  She was notified by Cloyde Reams RN.         Prior Anticoagulation Instructions: INR 2.3 Continue 5mg s daily except 7.5mg s on Wednesdays and Saturdays. Recheck in 4 weeks.   Current Anticoagulation Instructions: INR 2.5  Continue on same dosage 1 tablet daily except 1.5 tablets on Wednesdays and Saturdays.  Recheck in 4 weeks.

## 2010-05-24 ENCOUNTER — Ambulatory Visit (INDEPENDENT_AMBULATORY_CARE_PROVIDER_SITE_OTHER): Payer: Medicare Other | Admitting: Emergency Medicine

## 2010-05-24 DIAGNOSIS — Z7901 Long term (current) use of anticoagulants: Secondary | ICD-10-CM

## 2010-05-24 DIAGNOSIS — I4891 Unspecified atrial fibrillation: Secondary | ICD-10-CM

## 2010-05-24 LAB — POCT INR: INR: 2.9

## 2010-06-16 ENCOUNTER — Other Ambulatory Visit: Payer: Self-pay

## 2010-06-16 MED ORDER — WARFARIN SODIUM 5 MG PO TABS: 5.0000 mg | ORAL_TABLET | ORAL | Status: DC

## 2010-06-21 ENCOUNTER — Ambulatory Visit (INDEPENDENT_AMBULATORY_CARE_PROVIDER_SITE_OTHER): Payer: Medicare Other | Admitting: Emergency Medicine

## 2010-06-21 DIAGNOSIS — I4891 Unspecified atrial fibrillation: Secondary | ICD-10-CM

## 2010-06-21 DIAGNOSIS — Z7901 Long term (current) use of anticoagulants: Secondary | ICD-10-CM

## 2010-06-21 LAB — POCT INR: INR: 3

## 2010-07-17 ENCOUNTER — Encounter: Payer: Self-pay | Admitting: Cardiovascular Disease

## 2010-07-17 ENCOUNTER — Ambulatory Visit (INDEPENDENT_AMBULATORY_CARE_PROVIDER_SITE_OTHER): Payer: Medicare Other | Admitting: Cardiovascular Disease

## 2010-07-17 DIAGNOSIS — Z7901 Long term (current) use of anticoagulants: Secondary | ICD-10-CM

## 2010-07-17 DIAGNOSIS — I4891 Unspecified atrial fibrillation: Secondary | ICD-10-CM

## 2010-07-17 DIAGNOSIS — E785 Hyperlipidemia, unspecified: Secondary | ICD-10-CM

## 2010-07-17 DIAGNOSIS — R0602 Shortness of breath: Secondary | ICD-10-CM

## 2010-07-17 DIAGNOSIS — I10 Essential (primary) hypertension: Secondary | ICD-10-CM

## 2010-07-17 DIAGNOSIS — R0789 Other chest pain: Secondary | ICD-10-CM

## 2010-07-17 DIAGNOSIS — I059 Rheumatic mitral valve disease, unspecified: Secondary | ICD-10-CM

## 2010-07-17 NOTE — Assessment & Plan Note (Addendum)
I am concerned that her blood pressure has been running borderline low. We have suggested that if her systolic pressures less than 110, that she hold her Lasix, potassium, amlodipine and ramaprill. She can check her blood pressure later in the day. It is uncertain if her fatigue, malaise and nausea could be secondary to hypotension.

## 2010-07-17 NOTE — Assessment & Plan Note (Signed)
Heart rate is well controlled, chronic atrial fibrillation on warfarin. Continue her current medications in particular metoprolol.

## 2010-07-17 NOTE — Patient Instructions (Signed)
Your physician has requested that you regularly monitor and record your blood pressure readings at home. PLEASE CHECK BP IN AM.  IF top number of your BP is LESS than 110: HOLD Lasix. HOLD Potassium. HOLD Amlodipine. HOLD Ramipril. IF top number of BP is HIGHER than 110, you may continue medications that day. Please call our office with any issues prior to f/u if needed. Your physician recommends that you schedule a follow-up appointment in: 6 months

## 2010-07-17 NOTE — Progress Notes (Signed)
   Patient ID: Brenda Gonzalez, female    DOB: [birth year], 75 y.o.   MRN: [id number]  HPI Comments: 75 year old woman, patient of Dr. Dan Humphreys, presents for followup for atrial fibrillation. She has episodes of chronic chest pain, arm pain that is atypical in nature and comes on at rest. She has a stress test several years ago at [facility] that she reports was normal.  She reports that she has been very active in her garden though does have frequent episodes of malaise, occasional nausea and fatigue. She does provide blood pressures for almost every day which does show that some of her blood pressures have been very low, occasional systolics in the 90s, frequent systolic pressures less than 105. These numbers are prior to taking her medications and going to work in the garden.   Echocardiogram March 27 shows mild to moderately depressed EF, ejection fraction 40%, this was noted while in atrial fibrillation. Mild-to-moderate mitral regurgitation, mildly elevated right ventricular systolic pressure. This was prior to her diuresis and rhythm control at [facility].   EKG today shows atrial fibrillation with ventricular rate730 beats per minute         Review of Systems  Constitutional: Positive for fatigue.  HENT: Negative.   Eyes: Negative.   Respiratory: Negative.   Cardiovascular: Negative.   Gastrointestinal: Negative.   Musculoskeletal: Positive for back pain.  Skin: Negative.   Neurological: Positive for weakness.  Hematological: Negative.   Psychiatric/Behavioral: Negative.     BP 118/68  Pulse 73  Ht 5\' 3"  (1.6 m)  Wt 162 lb 6.4 oz (73.664 kg)  BMI 28.77 kg/m2  Physical Exam  Nursing note and vitals reviewed. Constitutional: She is oriented to person, place, and time. She appears well-developed and well-nourished.  HENT:  Head: Normocephalic.  Nose: Nose normal.  Mouth/Throat: Oropharynx is clear and moist.  Eyes: Conjunctivae are normal. Pupils are equal, round, and reactive to  light.  Neck: Normal range of motion. Neck supple. No JVD present.  Cardiovascular: Normal rate, S1 normal, S2 normal and intact distal pulses.  An irregularly irregular rhythm present. Exam reveals no gallop and no friction rub.   No murmur heard. Pulmonary/Chest: Effort normal and breath sounds normal. No respiratory distress. She has no wheezes. She has no rales. She exhibits no tenderness.  Abdominal: Soft. Bowel sounds are normal. She exhibits no distension. There is no tenderness.  Musculoskeletal: Normal range of motion. She exhibits no edema and no tenderness.  Lymphadenopathy:    She has no cervical adenopathy.  Neurological: She is alert and oriented to person, place, and time. Coordination normal.  Skin: Skin is warm and dry. No rash noted. No erythema.  Psychiatric: She has a normal mood and affect. Her behavior is normal. Judgment and thought content normal.         Assessment and Plan

## 2010-07-19 ENCOUNTER — Ambulatory Visit (INDEPENDENT_AMBULATORY_CARE_PROVIDER_SITE_OTHER): Payer: Medicare Other | Admitting: Emergency Medicine

## 2010-07-19 DIAGNOSIS — I4891 Unspecified atrial fibrillation: Secondary | ICD-10-CM

## 2010-07-19 DIAGNOSIS — Z7901 Long term (current) use of anticoagulants: Secondary | ICD-10-CM

## 2010-08-09 ENCOUNTER — Ambulatory Visit (INDEPENDENT_AMBULATORY_CARE_PROVIDER_SITE_OTHER): Payer: Medicare Other | Admitting: Emergency Medicine

## 2010-08-09 DIAGNOSIS — Z7901 Long term (current) use of anticoagulants: Secondary | ICD-10-CM

## 2010-08-09 DIAGNOSIS — I4891 Unspecified atrial fibrillation: Secondary | ICD-10-CM

## 2010-09-06 ENCOUNTER — Ambulatory Visit (INDEPENDENT_AMBULATORY_CARE_PROVIDER_SITE_OTHER): Payer: Medicare Other | Admitting: Emergency Medicine

## 2010-09-06 DIAGNOSIS — I4891 Unspecified atrial fibrillation: Secondary | ICD-10-CM

## 2010-09-06 DIAGNOSIS — Z7901 Long term (current) use of anticoagulants: Secondary | ICD-10-CM

## 2010-10-04 ENCOUNTER — Ambulatory Visit (INDEPENDENT_AMBULATORY_CARE_PROVIDER_SITE_OTHER): Payer: Medicare Other | Admitting: Emergency Medicine

## 2010-10-04 DIAGNOSIS — Z7901 Long term (current) use of anticoagulants: Secondary | ICD-10-CM

## 2010-10-04 DIAGNOSIS — I4891 Unspecified atrial fibrillation: Secondary | ICD-10-CM

## 2010-10-04 LAB — POCT INR: INR: 2.6

## 2010-10-30 ENCOUNTER — Telehealth: Payer: Self-pay

## 2010-10-30 MED ORDER — AMLODIPINE BESYLATE 5 MG PO TABS: 5.0000 mg | ORAL_TABLET | Freq: Every day | ORAL | Status: DC

## 2010-10-30 NOTE — Telephone Encounter (Signed)
Refill sent for amlodipine 5 mg take one tablet daily.  

## 2010-10-30 NOTE — Telephone Encounter (Signed)
Refill sent for amlodipine 5 mg daily

## 2010-10-31 ENCOUNTER — Telehealth: Payer: Self-pay

## 2010-10-31 MED ORDER — WARFARIN SODIUM 5 MG PO TABS: 5.0000 mg | ORAL_TABLET | ORAL | Status: DC

## 2010-10-31 NOTE — Telephone Encounter (Signed)
Refill sent for warfarin 5 mg take as directed to [facility] pharmacy

## 2010-11-01 ENCOUNTER — Ambulatory Visit (INDEPENDENT_AMBULATORY_CARE_PROVIDER_SITE_OTHER): Payer: Medicare Other | Admitting: Emergency Medicine

## 2010-11-01 DIAGNOSIS — Z7901 Long term (current) use of anticoagulants: Secondary | ICD-10-CM

## 2010-11-01 DIAGNOSIS — I4891 Unspecified atrial fibrillation: Secondary | ICD-10-CM

## 2010-11-29 ENCOUNTER — Ambulatory Visit (INDEPENDENT_AMBULATORY_CARE_PROVIDER_SITE_OTHER): Payer: Medicare Other | Admitting: Emergency Medicine

## 2010-11-29 DIAGNOSIS — Z7901 Long term (current) use of anticoagulants: Secondary | ICD-10-CM

## 2010-11-29 DIAGNOSIS — I4891 Unspecified atrial fibrillation: Secondary | ICD-10-CM

## 2010-12-20 ENCOUNTER — Ambulatory Visit: Payer: Self-pay | Admitting: Internal Medicine

## 2010-12-27 ENCOUNTER — Ambulatory Visit (INDEPENDENT_AMBULATORY_CARE_PROVIDER_SITE_OTHER): Payer: Medicare Other | Admitting: Emergency Medicine

## 2010-12-27 DIAGNOSIS — I4891 Unspecified atrial fibrillation: Secondary | ICD-10-CM

## 2010-12-27 DIAGNOSIS — Z7901 Long term (current) use of anticoagulants: Secondary | ICD-10-CM

## 2010-12-27 LAB — POCT INR: INR: 1.9

## 2011-01-15 ENCOUNTER — Ambulatory Visit: Payer: Medicare Other | Admitting: Cardiovascular Disease

## 2011-01-18 ENCOUNTER — Encounter: Payer: Self-pay | Admitting: Cardiovascular Disease

## 2011-01-24 ENCOUNTER — Encounter: Payer: Medicare Other | Admitting: Emergency Medicine

## 2011-01-25 ENCOUNTER — Ambulatory Visit (INDEPENDENT_AMBULATORY_CARE_PROVIDER_SITE_OTHER): Payer: Medicare Other | Admitting: Cardiovascular Disease

## 2011-01-25 ENCOUNTER — Encounter: Payer: Self-pay | Admitting: Cardiovascular Disease

## 2011-01-25 ENCOUNTER — Ambulatory Visit (INDEPENDENT_AMBULATORY_CARE_PROVIDER_SITE_OTHER): Payer: Medicare Other | Admitting: Emergency Medicine

## 2011-01-25 VITALS — BP 132/82 | HR 109 | Ht 63.0 in | Wt 164.5 lb

## 2011-01-25 DIAGNOSIS — I059 Rheumatic mitral valve disease, unspecified: Secondary | ICD-10-CM

## 2011-01-25 DIAGNOSIS — Z7901 Long term (current) use of anticoagulants: Secondary | ICD-10-CM

## 2011-01-25 DIAGNOSIS — I10 Essential (primary) hypertension: Secondary | ICD-10-CM

## 2011-01-25 DIAGNOSIS — I4891 Unspecified atrial fibrillation: Secondary | ICD-10-CM

## 2011-01-25 DIAGNOSIS — R0789 Other chest pain: Secondary | ICD-10-CM

## 2011-01-25 DIAGNOSIS — E785 Hyperlipidemia, unspecified: Secondary | ICD-10-CM | POA: Insufficient documentation

## 2011-01-25 LAB — POCT INR: INR: 2.6

## 2011-01-25 NOTE — Assessment & Plan Note (Signed)
No symptoms of heart failure at baseline, rare SOB. We have suggested that we Perform a repeat echocardiogram only for worsening edema or shortness of breath.

## 2011-01-25 NOTE — Assessment & Plan Note (Signed)
Heart rate numbers over the past month or so typically well-controlled though periodically her elevated.  Today in the clinic her heart rate is elevated with rates greater than 100. This could explain some of her symptoms or shortness of breath. We have suggested when she has shortness of breath that she take extra metoprolol and extra Lasix.

## 2011-01-25 NOTE — Progress Notes (Signed)
Patient ID: Brenda Gonzalez, female    DOB: 1935/08/17, 75 y.o.   MRN: [id number]  HPI Comments: 75 year old woman, patient of Dr. Yates Decamp, presents for followup for atrial fibrillation, chronic chest pain, arm pain that is atypical in nature and comes on at rest. She has a stress test several years ago at [facility] that she reports was normal.  She provides blood pressures today with systolics typically 120-130 range, heart rates in the 80-100 range. Overall she has been feeling well though does have  Periods of feeling very hot him a more commonly in the morning when she is in bed. She also believes she is having memory problems and is forgetting things. She does have occasional episodes of shortness of breath.    Echocardiogram March 27 shows mild to moderately depressed EF, ejection fraction 40%, this was noted while in atrial fibrillation. Mild-to-moderate mitral regurgitation, mildly elevated right ventricular systolic pressure. This was prior to her diuresis and rhythm control at [facility].   EKG today shows atrial fibrillation with ventricular rate 109 beats per minute , rare pvc     Outpatient Encounter Prescriptions as of 01/25/2011  Medication Sig Dispense Refill  . amLODipine (NORVASC) 5 MG tablet Take 1 tablet (5 mg total) by mouth daily.  30 tablet  6  . aspirin 81 MG EC tablet Take 81 mg by mouth daily.        . calcium carbonate (OS-CAL) 600 MG TABS Take 600 mg by mouth daily.        . cholecalciferol (VITAMIN D) 1000 UNITS tablet Take 1,000 Units by mouth daily.        . furosemide (LASIX) 20 MG tablet Take 20 mg by mouth daily.        . metoprolol (LOPRESSOR) 50 MG tablet Take 25 mg by mouth 2 (two) times daily.        . Omega-3 Fatty Acids (FISH OIL) 1000 MG CAPS Take 1 capsule by mouth daily.        . pantoprazole (PROTONIX) 40 MG tablet Take 40 mg by mouth daily.        . potassium chloride SA (K-DUR,KLOR-CON) 20 MEQ tablet Take 20 mEq by mouth daily.        . ramipril  (ALTACE) 2.5 MG tablet Take 2.5 mg by mouth daily.        . vitamin E 1000 UNIT capsule Take 1,000 Units by mouth daily.        Marland Kitchen warfarin (COUMADIN) 5 MG tablet Take 1 tablet (5 mg total) by mouth as directed.  30 tablet  3     Review of Systems  Constitutional:       Hot flashes while in bed, memory problems  HENT: Negative.   Eyes: Negative.   Respiratory: Negative.   Cardiovascular: Negative.   Gastrointestinal: Negative.   Musculoskeletal: Positive for back pain.  Skin: Negative.   Hematological: Negative.   Psychiatric/Behavioral: Negative.   All other systems reviewed and are negative.    BP 132/82  Pulse 109  Ht 5\' 3"  (1.6 m)  Wt 164 lb 8 oz (74.617 kg)  BMI 29.14 kg/m2  Physical Exam  Nursing note and vitals reviewed. Constitutional: She is oriented to person, place, and time. She appears well-developed and well-nourished.  HENT:  Head: Normocephalic.  Nose: Nose normal.  Mouth/Throat: Oropharynx is clear and moist.  Eyes: Conjunctivae are normal. Pupils are equal, round, and reactive to light.  Neck: Normal range of motion. Neck  supple. No JVD present.  Cardiovascular: Normal rate, S1 normal, S2 normal, normal heart sounds and intact distal pulses.  An irregularly irregular rhythm present. Exam reveals no gallop and no friction rub.   No murmur heard. Pulmonary/Chest: Effort normal and breath sounds normal. No respiratory distress. She has no wheezes. She has no rales. She exhibits no tenderness.  Abdominal: Soft. Bowel sounds are normal. She exhibits no distension. There is no tenderness.  Musculoskeletal: Normal range of motion. She exhibits no edema and no tenderness.  Lymphadenopathy:    She has no cervical adenopathy.  Neurological: She is alert and oriented to person, place, and time. Coordination normal.  Skin: Skin is warm and dry. No rash noted. No erythema.  Psychiatric: She has a normal mood and affect. Her behavior is normal. Judgment and thought  content normal.         Assessment and Plan

## 2011-01-25 NOTE — Assessment & Plan Note (Signed)
Blood pressure is well controlled on today's visit. No changes made to the medications. 

## 2011-01-25 NOTE — Assessment & Plan Note (Signed)
She is not injected and being on a statin. We have suggested she start red yeast rice.

## 2011-01-25 NOTE — Assessment & Plan Note (Signed)
She did not complain about chest pain on today's visit. In the past, patient has been noncardiac.

## 2011-01-25 NOTE — Patient Instructions (Addendum)
You are doing well. For shortness of breath, please take extra metoprolol, maybe extra lasix  Please try RED YEAST RICE for cholesterol.  Please call us if you have new issues that need to be addressed before your next appt.  The office will contact you for a follow up Appt. In 6 months

## 2011-02-11 NOTE — Progress Notes (Signed)
Addended by: Phoebe Sharps on: 02/11/2011 02:31 PM   Modules accepted: Level of Service

## 2011-02-21 ENCOUNTER — Ambulatory Visit (INDEPENDENT_AMBULATORY_CARE_PROVIDER_SITE_OTHER): Payer: Medicare Other | Admitting: Emergency Medicine

## 2011-02-21 ENCOUNTER — Encounter: Payer: Medicare Other | Admitting: Emergency Medicine

## 2011-02-21 DIAGNOSIS — Z7901 Long term (current) use of anticoagulants: Secondary | ICD-10-CM

## 2011-02-21 DIAGNOSIS — I4891 Unspecified atrial fibrillation: Secondary | ICD-10-CM

## 2011-03-15 ENCOUNTER — Telehealth: Payer: Self-pay

## 2011-03-15 MED ORDER — WARFARIN SODIUM 5 MG PO TABS: 5.0000 mg | ORAL_TABLET | ORAL | Status: DC

## 2011-03-15 NOTE — Telephone Encounter (Signed)
Refill for warfarin

## 2011-03-16 ENCOUNTER — Telehealth: Payer: Self-pay

## 2011-03-16 MED ORDER — WARFARIN SODIUM 5 MG PO TABS: 5.0000 mg | ORAL_TABLET | ORAL | Status: DC

## 2011-03-16 NOTE — Telephone Encounter (Signed)
Refill sent warfarin.

## 2011-03-21 ENCOUNTER — Ambulatory Visit (INDEPENDENT_AMBULATORY_CARE_PROVIDER_SITE_OTHER): Payer: Medicare Other | Admitting: Emergency Medicine

## 2011-03-21 DIAGNOSIS — Z7901 Long term (current) use of anticoagulants: Secondary | ICD-10-CM

## 2011-03-21 DIAGNOSIS — I4891 Unspecified atrial fibrillation: Secondary | ICD-10-CM

## 2011-03-21 LAB — POCT INR: INR: 2.4

## 2011-05-02 ENCOUNTER — Ambulatory Visit (INDEPENDENT_AMBULATORY_CARE_PROVIDER_SITE_OTHER): Payer: Medicare Other

## 2011-05-02 DIAGNOSIS — Z7901 Long term (current) use of anticoagulants: Secondary | ICD-10-CM

## 2011-05-02 DIAGNOSIS — I4891 Unspecified atrial fibrillation: Secondary | ICD-10-CM

## 2011-05-02 LAB — POCT INR: INR: 2.4

## 2011-06-13 ENCOUNTER — Ambulatory Visit (INDEPENDENT_AMBULATORY_CARE_PROVIDER_SITE_OTHER): Payer: Medicare Other

## 2011-06-13 DIAGNOSIS — I4891 Unspecified atrial fibrillation: Secondary | ICD-10-CM

## 2011-06-13 DIAGNOSIS — Z7901 Long term (current) use of anticoagulants: Secondary | ICD-10-CM

## 2011-06-13 LAB — POCT INR: INR: 3.3

## 2011-07-11 ENCOUNTER — Ambulatory Visit (INDEPENDENT_AMBULATORY_CARE_PROVIDER_SITE_OTHER): Payer: Medicare Other

## 2011-07-11 DIAGNOSIS — I4891 Unspecified atrial fibrillation: Secondary | ICD-10-CM

## 2011-07-11 DIAGNOSIS — Z7901 Long term (current) use of anticoagulants: Secondary | ICD-10-CM

## 2011-07-11 LAB — POCT INR: INR: 3

## 2011-07-26 ENCOUNTER — Ambulatory Visit (INDEPENDENT_AMBULATORY_CARE_PROVIDER_SITE_OTHER): Payer: Medicare Other | Admitting: Cardiovascular Disease

## 2011-07-26 ENCOUNTER — Encounter: Payer: Self-pay | Admitting: Cardiovascular Disease

## 2011-07-26 VITALS — BP 120/80 | HR 92 | Ht 63.0 in | Wt 163.5 lb

## 2011-07-26 DIAGNOSIS — E785 Hyperlipidemia, unspecified: Secondary | ICD-10-CM

## 2011-07-26 DIAGNOSIS — R0789 Other chest pain: Secondary | ICD-10-CM

## 2011-07-26 DIAGNOSIS — I10 Essential (primary) hypertension: Secondary | ICD-10-CM

## 2011-07-26 DIAGNOSIS — I4891 Unspecified atrial fibrillation: Secondary | ICD-10-CM

## 2011-07-26 DIAGNOSIS — R0602 Shortness of breath: Secondary | ICD-10-CM

## 2011-07-26 NOTE — Assessment & Plan Note (Signed)
Cholesterol is tremendously elevated. We have discussed various options with her. She does not want a statin. We have given her samples of zetia 10 mg to try. She'll continue red yeast rice. She may also benefit from [facility], possibly a fibrate. We have encouraged weight loss.

## 2011-07-26 NOTE — Patient Instructions (Addendum)
You are doing well. No medication changes were made.  Please move the potassium to the daytime Try two stomach pills if nausea in the AM continues  Take extra metoprolol as needed for fast heart rate   Please try zetia one a day for high cholesterol  Please call us if you have new issues that need to be addressed before your next appt.  Your physician wants you to follow-up in: 6 months.  You will receive a reminder letter in the mail two months in advance. If you don't receive a letter, please call our office to schedule the follow-up appointment.

## 2011-07-26 NOTE — Assessment & Plan Note (Signed)
Blood pressure is well controlled on today's visit. No changes made to the medications. 

## 2011-07-26 NOTE — Assessment & Plan Note (Signed)
Continues in atrial fibrillation. No significant symptoms. Continue current medications. Low bleeding risk on anticoagulation.

## 2011-07-26 NOTE — Progress Notes (Signed)
Patient ID: Brenda Gonzalez, female    DOB: 1935/03/29, 76 y.o.   MRN: [id number]  HPI Comments: 76 year old woman, patient of Dr. Yates Decamp, history of  atrial fibrillation, chronic chest pain, arm pain that is atypical in nature and comes on at rest. She has a stress test several years ago at [facility] that she reports was normal. She presents for routine followup. History of statin intolerance and significant hyperlipidemia.  She reports that she has been having some nausea in the morning, sometimes with slight headache. Symptoms seem to go away during the daytime. She does take potassium at nighttime. She also has some cramping in her legs at night. Otherwise she feels well with no significant chest pain or shortness of breath. No significant lightheadedness or dizziness   Echocardiogram March 27 shows mild to moderately depressed EF, ejection fraction 40%, this was noted while in atrial fibrillation. Mild-to-moderate mitral regurgitation, mildly elevated right ventricular systolic pressure. This was prior to her diuresis and rhythm control at [facility].   EKG today shows atrial fibrillation with ventricular rate 92 beats per minute  Recent lab work shows total cholesterol 319, LDL 223, HDL 56   Outpatient Encounter Prescriptions as of 07/26/2011  Medication Sig Dispense Refill  . amLODipine (NORVASC) 5 MG tablet Take 1 tablet (5 mg total) by mouth daily.  30 tablet  6  . Ascorbic Acid (VITAMIN C) 1000 MG tablet Take 1,000 mg by mouth daily.      Marland Kitchen aspirin 81 MG EC tablet Take 81 mg by mouth daily.        . calcium carbonate (OS-CAL) 600 MG TABS Take 600 mg by mouth daily.        . cholecalciferol (VITAMIN D) 1000 UNITS tablet Take 1,000 Units by mouth daily.        . furosemide (LASIX) 20 MG tablet Take 20 mg by mouth daily.        . metoprolol tartrate (LOPRESSOR) 25 MG tablet Take 25 mg by mouth 2 (two) times daily.      . pantoprazole (PROTONIX) 40 MG tablet Take 40 mg by mouth daily.         . potassium chloride SA (K-DUR,KLOR-CON) 20 MEQ tablet Take 20 mEq by mouth daily.        . ramipril (ALTACE) 2.5 MG tablet Take 2.5 mg by mouth daily.        . Red Yeast Rice 600 MG CAPS Take 600 mg by mouth 2 (two) times daily.      . vitamin E 1000 UNIT capsule Take 1,000 Units by mouth daily.        Marland Kitchen warfarin (COUMADIN) 5 MG tablet Take 1 tablet (5 mg total) by mouth as directed.  30 tablet  3  . DISCONTD: metoprolol (LOPRESSOR) 50 MG tablet Take 25 mg by mouth 2 (two) times daily.        Marland Kitchen DISCONTD: Omega-3 Fatty Acids (FISH OIL) 1000 MG CAPS Take 1 capsule by mouth daily.           Review of Systems  Constitutional: Negative.        Hot flashes while in bed, memory problems  HENT: Negative.   Eyes: Negative.   Respiratory: Negative.   Cardiovascular: Negative.   Gastrointestinal: Positive for nausea.  Skin: Negative.   Neurological: Negative.   Hematological: Negative.   Psychiatric/Behavioral: Negative.   All other systems reviewed and are negative.    BP 120/80  Pulse 92  Ht  5\' 3"  (1.6 m)  Wt 163 lb 8 oz (74.163 kg)  BMI 28.96 kg/m2  Physical Exam  Nursing note and vitals reviewed. Constitutional: She is oriented to person, place, and time. She appears well-developed and well-nourished.  HENT:  Head: Normocephalic.  Nose: Nose normal.  Mouth/Throat: Oropharynx is clear and moist.  Eyes: Conjunctivae are normal. Pupils are equal, round, and reactive to light.  Neck: Normal range of motion. Neck supple. No JVD present.  Cardiovascular: Normal rate, S1 normal, S2 normal, normal heart sounds and intact distal pulses.  An irregularly irregular rhythm present. Exam reveals no gallop and no friction rub.   No murmur heard. Pulmonary/Chest: Effort normal and breath sounds normal. No respiratory distress. She has no wheezes. She has no rales. She exhibits no tenderness.  Abdominal: Soft. Bowel sounds are normal. She exhibits no distension. There is no tenderness.    Musculoskeletal: Normal range of motion. She exhibits no edema and no tenderness.  Lymphadenopathy:    She has no cervical adenopathy.  Neurological: She is alert and oriented to person, place, and time. Coordination normal.  Skin: Skin is warm and dry. No rash noted. No erythema.  Psychiatric: She has a normal mood and affect. Her behavior is normal. Judgment and thought content normal.         Assessment and Plan

## 2011-08-07 ENCOUNTER — Other Ambulatory Visit: Payer: Self-pay | Admitting: Cardiovascular Disease

## 2011-08-07 NOTE — Telephone Encounter (Signed)
Refilled Warfarin.  

## 2011-08-08 ENCOUNTER — Other Ambulatory Visit: Payer: Self-pay

## 2011-08-08 ENCOUNTER — Ambulatory Visit (INDEPENDENT_AMBULATORY_CARE_PROVIDER_SITE_OTHER): Payer: Medicare Other

## 2011-08-08 ENCOUNTER — Telehealth: Payer: Self-pay

## 2011-08-08 DIAGNOSIS — Z7901 Long term (current) use of anticoagulants: Secondary | ICD-10-CM

## 2011-08-08 DIAGNOSIS — I4891 Unspecified atrial fibrillation: Secondary | ICD-10-CM

## 2011-08-08 MED ORDER — FENOFIBRATE 145 MG PO TABS: 145.0000 mg | ORAL_TABLET | Freq: Every day | ORAL | Status: DC

## 2011-08-08 NOTE — Telephone Encounter (Signed)
Other options include fenofibrate 145 mg daily Or WelChol 3.5 gm split 3 times a day

## 2011-08-08 NOTE — Telephone Encounter (Signed)
LMTCB

## 2011-08-08 NOTE — Telephone Encounter (Signed)
Pt states she was started on Zetia 10mg  daily 2 weeks ago, pt does not think she can tolerate.  Pt reports she has muscle aches, fever, nausea, headache, "flu like" symptoms, no energy since she started taking this medication.  Pt states she has been intolerant to cholesterol meds in the past and has failed multiple statin drug trials in the past.  Pt states Dr Mariah Milling wanted up to update on how she was doing on this medication, pt states she is still taking at the present, but symptoms seem to gradually be worsening. Please call pt and advise of recommendations. Thanks

## 2011-08-08 NOTE — Telephone Encounter (Signed)
Pt informed.  Will try fenofibrate 145 mg daily.  Will send in new RX.

## 2011-08-10 ENCOUNTER — Telehealth: Payer: Self-pay

## 2011-08-10 ENCOUNTER — Other Ambulatory Visit: Payer: Self-pay

## 2011-08-10 MED ORDER — EZETIMIBE 10 MG PO TABS: 10.0000 mg | ORAL_TABLET | Freq: Every day | ORAL | Status: DC

## 2011-08-10 NOTE — Telephone Encounter (Signed)
Pt called. She picked up rx for fenofibrate and this costs too much $. She wishes to just stay on the zetia. She needs refill sent in.  She will call us should symptoms return or get worse on Zetia.

## 2011-09-05 ENCOUNTER — Ambulatory Visit (INDEPENDENT_AMBULATORY_CARE_PROVIDER_SITE_OTHER): Payer: Medicare Other

## 2011-09-05 DIAGNOSIS — Z7901 Long term (current) use of anticoagulants: Secondary | ICD-10-CM

## 2011-09-05 DIAGNOSIS — I4891 Unspecified atrial fibrillation: Secondary | ICD-10-CM

## 2011-09-05 LAB — POCT INR: INR: 4.7

## 2011-09-19 ENCOUNTER — Ambulatory Visit (INDEPENDENT_AMBULATORY_CARE_PROVIDER_SITE_OTHER): Payer: Medicare Other

## 2011-09-19 DIAGNOSIS — I4891 Unspecified atrial fibrillation: Secondary | ICD-10-CM

## 2011-09-19 DIAGNOSIS — Z7901 Long term (current) use of anticoagulants: Secondary | ICD-10-CM

## 2011-10-10 ENCOUNTER — Ambulatory Visit (INDEPENDENT_AMBULATORY_CARE_PROVIDER_SITE_OTHER): Payer: Medicare Other

## 2011-10-10 DIAGNOSIS — Z7901 Long term (current) use of anticoagulants: Secondary | ICD-10-CM

## 2011-10-10 DIAGNOSIS — I4891 Unspecified atrial fibrillation: Secondary | ICD-10-CM

## 2011-10-10 LAB — POCT INR: INR: 2.3

## 2011-11-07 ENCOUNTER — Ambulatory Visit (INDEPENDENT_AMBULATORY_CARE_PROVIDER_SITE_OTHER): Payer: Medicare Other

## 2011-11-07 DIAGNOSIS — Z7901 Long term (current) use of anticoagulants: Secondary | ICD-10-CM

## 2011-11-07 DIAGNOSIS — I4891 Unspecified atrial fibrillation: Secondary | ICD-10-CM

## 2011-11-07 LAB — POCT INR: INR: 2.2

## 2011-12-15 ENCOUNTER — Other Ambulatory Visit: Payer: Self-pay | Admitting: Cardiovascular Disease

## 2011-12-17 NOTE — Telephone Encounter (Signed)
Please review and refill, Thanks. 

## 2011-12-19 ENCOUNTER — Ambulatory Visit (INDEPENDENT_AMBULATORY_CARE_PROVIDER_SITE_OTHER): Payer: Medicare Other

## 2011-12-19 DIAGNOSIS — Z7901 Long term (current) use of anticoagulants: Secondary | ICD-10-CM

## 2011-12-19 DIAGNOSIS — I4891 Unspecified atrial fibrillation: Secondary | ICD-10-CM

## 2011-12-19 LAB — POCT INR: INR: 2.3

## 2012-01-07 ENCOUNTER — Ambulatory Visit: Payer: Self-pay | Admitting: Internal Medicine

## 2012-01-30 ENCOUNTER — Ambulatory Visit (INDEPENDENT_AMBULATORY_CARE_PROVIDER_SITE_OTHER): Payer: Medicare Other

## 2012-01-30 DIAGNOSIS — Z7901 Long term (current) use of anticoagulants: Secondary | ICD-10-CM

## 2012-01-30 DIAGNOSIS — I4891 Unspecified atrial fibrillation: Secondary | ICD-10-CM

## 2012-03-19 ENCOUNTER — Ambulatory Visit (INDEPENDENT_AMBULATORY_CARE_PROVIDER_SITE_OTHER): Payer: Medicare Other | Admitting: *Deleted

## 2012-03-19 DIAGNOSIS — I4891 Unspecified atrial fibrillation: Secondary | ICD-10-CM

## 2012-03-19 DIAGNOSIS — Z7901 Long term (current) use of anticoagulants: Secondary | ICD-10-CM

## 2012-03-19 LAB — POCT INR: INR: 1.6

## 2012-04-09 ENCOUNTER — Ambulatory Visit (INDEPENDENT_AMBULATORY_CARE_PROVIDER_SITE_OTHER): Payer: Medicare Other

## 2012-04-09 DIAGNOSIS — Z7901 Long term (current) use of anticoagulants: Secondary | ICD-10-CM

## 2012-04-09 DIAGNOSIS — I4891 Unspecified atrial fibrillation: Secondary | ICD-10-CM

## 2012-04-09 LAB — POCT INR: INR: 2

## 2012-04-19 ENCOUNTER — Other Ambulatory Visit: Payer: Self-pay | Admitting: Cardiovascular Disease

## 2012-04-21 NOTE — Telephone Encounter (Signed)
Please review for refill.  

## 2012-04-25 ENCOUNTER — Ambulatory Visit: Payer: Self-pay | Admitting: Physician Assistant

## 2012-04-30 ENCOUNTER — Ambulatory Visit (INDEPENDENT_AMBULATORY_CARE_PROVIDER_SITE_OTHER): Payer: Medicare Other

## 2012-04-30 DIAGNOSIS — Z7901 Long term (current) use of anticoagulants: Secondary | ICD-10-CM

## 2012-04-30 DIAGNOSIS — I4891 Unspecified atrial fibrillation: Secondary | ICD-10-CM

## 2012-05-28 ENCOUNTER — Encounter: Payer: Self-pay | Admitting: Cardiovascular Disease

## 2012-05-28 ENCOUNTER — Ambulatory Visit (INDEPENDENT_AMBULATORY_CARE_PROVIDER_SITE_OTHER): Payer: Medicare Other | Admitting: Cardiovascular Disease

## 2012-05-28 ENCOUNTER — Ambulatory Visit (INDEPENDENT_AMBULATORY_CARE_PROVIDER_SITE_OTHER): Payer: Medicare Other

## 2012-05-28 VITALS — BP 118/72 | HR 92 | Ht 63.0 in | Wt 165.5 lb

## 2012-05-28 DIAGNOSIS — I4891 Unspecified atrial fibrillation: Secondary | ICD-10-CM

## 2012-05-28 DIAGNOSIS — E785 Hyperlipidemia, unspecified: Secondary | ICD-10-CM

## 2012-05-28 DIAGNOSIS — R0602 Shortness of breath: Secondary | ICD-10-CM

## 2012-05-28 DIAGNOSIS — I10 Essential (primary) hypertension: Secondary | ICD-10-CM

## 2012-05-28 DIAGNOSIS — R0789 Other chest pain: Secondary | ICD-10-CM

## 2012-05-28 DIAGNOSIS — Z7901 Long term (current) use of anticoagulants: Secondary | ICD-10-CM

## 2012-05-28 LAB — POCT INR: INR: 2.2

## 2012-05-28 NOTE — Assessment & Plan Note (Signed)
Blood pressure is low. We have suggested she hold her amlodipine. Some of her measurements show systolic pressures in the 90s on a frequent basis. Given her general malaise which is nonspecific, we will hold the amlodipine.

## 2012-05-28 NOTE — Progress Notes (Signed)
Patient ID: Brenda Gonzalez, female    DOB: [birth year], 77 y.o.   MRN: [id number]  HPI Comments: 48 -year-old woman, patient of Dr. Yates Decamp, history of  Chronic atrial fibrillation on warfarin, chronic chest pain, prior arm pain that was atypical in nature and comes on at rest.  stress test several years ago at [facility] that she reports was normal. She presents for routine followup. History of statin intolerance and significant hyperlipidemia.  She has numerous complaints today, blood pressure has been running low at times, general malaise, diffuse pain across her abdomen, right flank, between her shoulder blades. MRI x2 by her report showing no significant spinal issues. She is on Neurontin for her chronic pain.  Severe hyperlipidemia by history. Recent cholesterol greater than 300. Refusing statins Also does not want any other medications given she does not feel well   Prior Echocardiogram shows mild to moderately depressed EF, ejection fraction 40%, this was noted while in atrial fibrillation. Mild-to-moderate mitral regurgitation, mildly elevated right ventricular systolic pressure. This was prior to her diuresis and rhythm control at [facility].   EKG today shows atrial fibrillation with ventricular rate 92 beats per minute   total cholesterol 319, LDL 223, HDL 56   Outpatient Encounter Prescriptions as of 05/28/2012  Medication Sig Dispense Refill  . amLODipine (NORVASC) 5 MG tablet Take 1 tablet (5 mg total) by mouth daily.  30 tablet  6  . Ascorbic Acid (VITAMIN C) 1000 MG tablet Take 1,000 mg by mouth daily.      Marland Kitchen aspirin 81 MG EC tablet Take 81 mg by mouth daily.        . calcium carbonate (OS-CAL) 600 MG TABS Take 600 mg by mouth 2 (two) times daily.       . cholecalciferol (VITAMIN D) 1000 UNITS tablet Take 1,000 Units by mouth daily.        . furosemide (LASIX) 20 MG tablet Take 20 mg by mouth daily.        Marland Kitchen gabapentin (NEURONTIN) 100 MG capsule Take 100 mg by mouth at bedtime.       . metoprolol tartrate (LOPRESSOR) 25 MG tablet Take 25 mg by mouth 2 (two) times daily.      . pantoprazole (PROTONIX) 40 MG tablet Take 40 mg by mouth daily.        . potassium chloride SA (K-DUR,KLOR-CON) 20 MEQ tablet Take 20 mEq by mouth daily.        . ramipril (ALTACE) 2.5 MG tablet Take 2.5 mg by mouth daily.        . Red Yeast Rice 600 MG CAPS Take 600 mg by mouth 2 (two) times daily.      . vitamin E 1000 UNIT capsule Take 1,000 Units by mouth daily.        Marland Kitchen warfarin (COUMADIN) 5 MG tablet TAKE 1 TABLET BY MOUTH EVERY DAY AS DIRECTED  30 tablet  3  . [DISCONTINUED] ezetimibe (ZETIA) 10 MG tablet Take 1 tablet (10 mg total) by mouth daily.  90 tablet  3   No facility-administered encounter medications on file as of 05/28/2012.     Review of Systems  Constitutional: Negative.        General malaise, back pain  HENT: Negative.   Eyes: Negative.   Respiratory: Negative.   Cardiovascular: Negative.   Musculoskeletal: Positive for back pain and arthralgias.  Skin: Negative.   Neurological: Negative.   Psychiatric/Behavioral: Negative.   All other systems reviewed and  are negative.    BP 118/72  Pulse 92  Ht 5\' 3"  (1.6 m)  Wt 165 lb 8 oz (75.07 kg)  BMI 29.32 kg/m2  Physical Exam  Nursing note and vitals reviewed. Constitutional: She is oriented to person, place, and time. She appears well-developed and well-nourished.  HENT:  Head: Normocephalic.  Nose: Nose normal.  Mouth/Throat: Oropharynx is clear and moist.  Eyes: Conjunctivae are normal. Pupils are equal, round, and reactive to light.  Neck: Normal range of motion. Neck supple. No JVD present.  Cardiovascular: Normal rate, S1 normal, S2 normal, normal heart sounds and intact distal pulses.  An irregularly irregular rhythm present. Exam reveals no gallop and no friction rub.   No murmur heard. Pulmonary/Chest: Effort normal and breath sounds normal. No respiratory distress. She has no wheezes. She has no  rales. She exhibits no tenderness.  Abdominal: Soft. Bowel sounds are normal. She exhibits no distension. There is no tenderness.  Musculoskeletal: Normal range of motion. She exhibits no edema and no tenderness.  Lymphadenopathy:    She has no cervical adenopathy.  Neurological: She is alert and oriented to person, place, and time. Coordination normal.  Skin: Skin is warm and dry. No rash noted. No erythema.  Psychiatric: She has a normal mood and affect. Her behavior is normal. Judgment and thought content normal.    Assessment and Plan

## 2012-05-28 NOTE — Assessment & Plan Note (Signed)
We have suggested she stay on her current medications for rate control, she takes metoprolol twice a day. Currently on warfarin.

## 2012-05-28 NOTE — Assessment & Plan Note (Signed)
Very elevated cholesterol. She does not want a statin. We did discuss other options and she does not want to try any additional medications at this time as she has general malaise. In general she does not seem very concerned about her high cholesterol.

## 2012-05-28 NOTE — Assessment & Plan Note (Signed)
She does not report any significant chest pain on today's visit. Most of her complaints concern chronic shoulder back pain. Residual pain from her shingles.

## 2012-05-28 NOTE — Patient Instructions (Addendum)
You are doing well. Please hold the amlodpine, blood pressure is low  Call us if you would like to try a cholesterol pill (binding pill)  Please call us if you have new issues that need to be addressed before your next appt.  Your physician wants you to follow-up in: 12 months.  You will receive a reminder letter in the mail two months in advance. If you don't receive a letter, please call our office to schedule the follow-up appointment.

## 2012-06-25 ENCOUNTER — Ambulatory Visit (INDEPENDENT_AMBULATORY_CARE_PROVIDER_SITE_OTHER): Payer: Medicare Other

## 2012-06-25 DIAGNOSIS — I4891 Unspecified atrial fibrillation: Secondary | ICD-10-CM

## 2012-06-25 DIAGNOSIS — Z7901 Long term (current) use of anticoagulants: Secondary | ICD-10-CM

## 2012-07-09 ENCOUNTER — Ambulatory Visit (INDEPENDENT_AMBULATORY_CARE_PROVIDER_SITE_OTHER): Payer: Medicare Other

## 2012-07-09 DIAGNOSIS — I4891 Unspecified atrial fibrillation: Secondary | ICD-10-CM

## 2012-07-09 DIAGNOSIS — Z7901 Long term (current) use of anticoagulants: Secondary | ICD-10-CM

## 2012-07-23 ENCOUNTER — Ambulatory Visit (INDEPENDENT_AMBULATORY_CARE_PROVIDER_SITE_OTHER): Payer: Medicare Other

## 2012-07-23 DIAGNOSIS — Z7901 Long term (current) use of anticoagulants: Secondary | ICD-10-CM

## 2012-07-23 DIAGNOSIS — I4891 Unspecified atrial fibrillation: Secondary | ICD-10-CM

## 2012-08-13 ENCOUNTER — Ambulatory Visit (INDEPENDENT_AMBULATORY_CARE_PROVIDER_SITE_OTHER): Payer: Medicare Other | Admitting: *Deleted

## 2012-08-13 DIAGNOSIS — Z7901 Long term (current) use of anticoagulants: Secondary | ICD-10-CM

## 2012-08-13 DIAGNOSIS — I4891 Unspecified atrial fibrillation: Secondary | ICD-10-CM

## 2012-08-13 LAB — POCT INR: INR: 2.4

## 2012-08-29 ENCOUNTER — Other Ambulatory Visit: Payer: Self-pay | Admitting: Cardiovascular Disease

## 2012-08-29 NOTE — Telephone Encounter (Signed)
Please review refill for warfarin. 

## 2012-09-10 ENCOUNTER — Ambulatory Visit (INDEPENDENT_AMBULATORY_CARE_PROVIDER_SITE_OTHER): Payer: Medicare Other | Admitting: *Deleted

## 2012-09-10 DIAGNOSIS — Z7901 Long term (current) use of anticoagulants: Secondary | ICD-10-CM

## 2012-09-10 DIAGNOSIS — I4891 Unspecified atrial fibrillation: Secondary | ICD-10-CM

## 2012-10-15 ENCOUNTER — Ambulatory Visit (INDEPENDENT_AMBULATORY_CARE_PROVIDER_SITE_OTHER): Payer: Medicare Other

## 2012-10-15 DIAGNOSIS — Z7901 Long term (current) use of anticoagulants: Secondary | ICD-10-CM

## 2012-10-15 DIAGNOSIS — I4891 Unspecified atrial fibrillation: Secondary | ICD-10-CM

## 2012-11-26 ENCOUNTER — Ambulatory Visit (INDEPENDENT_AMBULATORY_CARE_PROVIDER_SITE_OTHER): Payer: Medicare Other | Admitting: General Practice

## 2012-11-26 DIAGNOSIS — Z7901 Long term (current) use of anticoagulants: Secondary | ICD-10-CM

## 2012-11-26 DIAGNOSIS — I4891 Unspecified atrial fibrillation: Secondary | ICD-10-CM

## 2012-12-18 ENCOUNTER — Other Ambulatory Visit: Payer: Self-pay | Admitting: Cardiovascular Disease

## 2012-12-18 NOTE — Telephone Encounter (Signed)
Please review for a refill.

## 2013-01-07 ENCOUNTER — Ambulatory Visit: Payer: Self-pay | Admitting: Internal Medicine

## 2013-01-07 ENCOUNTER — Ambulatory Visit (INDEPENDENT_AMBULATORY_CARE_PROVIDER_SITE_OTHER): Payer: Medicare Other | Admitting: General Practice

## 2013-01-07 DIAGNOSIS — I4891 Unspecified atrial fibrillation: Secondary | ICD-10-CM

## 2013-01-07 DIAGNOSIS — Z7901 Long term (current) use of anticoagulants: Secondary | ICD-10-CM

## 2013-01-07 LAB — POCT INR: INR: 2.1

## 2013-01-30 ENCOUNTER — Other Ambulatory Visit: Payer: Self-pay | Admitting: Cardiovascular Disease

## 2013-01-30 NOTE — Telephone Encounter (Signed)
Please Review and refill, Thank You.

## 2013-02-02 ENCOUNTER — Other Ambulatory Visit: Payer: Self-pay

## 2013-02-02 MED ORDER — WARFARIN SODIUM 5 MG PO TABS: ORAL_TABLET | ORAL | Status: DC

## 2013-02-02 NOTE — Telephone Encounter (Signed)
Please review for warfarin refill.

## 2013-02-03 ENCOUNTER — Telehealth: Payer: Self-pay | Admitting: *Deleted

## 2013-02-03 NOTE — Telephone Encounter (Signed)
Refill already sent to [facility] and the Pharmacy confirmed that refill ready for pt to pick up.Called pt with this information and she states understanding

## 2013-02-03 NOTE — Telephone Encounter (Signed)
Please review and Refill, Thank you.

## 2013-02-03 NOTE — Telephone Encounter (Signed)
Talked with pt and she has [facility] at [facility] and that is where she wanted refill called to and pt instructed refill is ready for her to pick up and she states understanding

## 2013-02-18 ENCOUNTER — Ambulatory Visit (INDEPENDENT_AMBULATORY_CARE_PROVIDER_SITE_OTHER): Payer: Medicare Other

## 2013-02-18 DIAGNOSIS — I4891 Unspecified atrial fibrillation: Secondary | ICD-10-CM

## 2013-02-18 DIAGNOSIS — Z7901 Long term (current) use of anticoagulants: Secondary | ICD-10-CM

## 2013-02-18 LAB — POCT INR: INR: 2.2

## 2013-04-03 ENCOUNTER — Ambulatory Visit (INDEPENDENT_AMBULATORY_CARE_PROVIDER_SITE_OTHER): Payer: Medicare Other

## 2013-04-03 DIAGNOSIS — Z7901 Long term (current) use of anticoagulants: Secondary | ICD-10-CM

## 2013-04-03 DIAGNOSIS — Z5181 Encounter for therapeutic drug level monitoring: Secondary | ICD-10-CM

## 2013-04-03 DIAGNOSIS — I4891 Unspecified atrial fibrillation: Secondary | ICD-10-CM

## 2013-04-03 LAB — POCT INR: INR: 2.3

## 2013-05-13 ENCOUNTER — Ambulatory Visit (INDEPENDENT_AMBULATORY_CARE_PROVIDER_SITE_OTHER): Payer: Medicare Other

## 2013-05-13 DIAGNOSIS — Z7901 Long term (current) use of anticoagulants: Secondary | ICD-10-CM

## 2013-05-13 DIAGNOSIS — Z5181 Encounter for therapeutic drug level monitoring: Secondary | ICD-10-CM

## 2013-05-13 DIAGNOSIS — I4891 Unspecified atrial fibrillation: Secondary | ICD-10-CM

## 2013-05-13 LAB — POCT INR: INR: 2.2

## 2013-05-25 ENCOUNTER — Telehealth: Payer: Self-pay

## 2013-05-25 NOTE — Telephone Encounter (Signed)
Faxed cardiac clearance for pt to proceed w/ colonoscopy on 06/22/13 to [facility] at [phone]. "Please provide cardiac clearance.  Pt is to hold warfarin x 5 days." "Pt has been cleared for surgery".

## 2013-05-28 ENCOUNTER — Ambulatory Visit (INDEPENDENT_AMBULATORY_CARE_PROVIDER_SITE_OTHER): Payer: Medicare Other | Admitting: Cardiovascular Disease

## 2013-05-28 ENCOUNTER — Encounter: Payer: Self-pay | Admitting: Cardiovascular Disease

## 2013-05-28 VITALS — BP 118/80 | HR 81 | Ht 63.0 in | Wt 165.8 lb

## 2013-05-28 DIAGNOSIS — I10 Essential (primary) hypertension: Secondary | ICD-10-CM

## 2013-05-28 DIAGNOSIS — I5033 Acute on chronic diastolic (congestive) heart failure: Secondary | ICD-10-CM | POA: Insufficient documentation

## 2013-05-28 DIAGNOSIS — I509 Heart failure, unspecified: Secondary | ICD-10-CM

## 2013-05-28 DIAGNOSIS — I4891 Unspecified atrial fibrillation: Secondary | ICD-10-CM

## 2013-05-28 DIAGNOSIS — E669 Obesity, unspecified: Secondary | ICD-10-CM

## 2013-05-28 DIAGNOSIS — E785 Hyperlipidemia, unspecified: Secondary | ICD-10-CM

## 2013-05-28 DIAGNOSIS — R0602 Shortness of breath: Secondary | ICD-10-CM

## 2013-05-28 MED ORDER — FUROSEMIDE 20 MG PO TABS: 20.0000 mg | ORAL_TABLET | Freq: Two times a day (BID) | ORAL | Status: DC | PRN

## 2013-05-28 NOTE — Assessment & Plan Note (Signed)
She has very high cholesterol. Not interested in a statin

## 2013-05-28 NOTE — Assessment & Plan Note (Signed)
Worsening shortness of breath particularly in the evening when she lays flat. I suspect she is having mild CHF symptoms. We have recommended she increase her Lasix up to 20 g twice a day for several days, cut back on her fluid intake. We've talked extensively about salt intake, fluid intake, measuring her weight and taking extra Lasix for shortness of breath symptoms.

## 2013-05-28 NOTE — Assessment & Plan Note (Signed)
Heart rate relatively well-controlled. She is on warfarin with no recent bleed. No recent falls

## 2013-05-28 NOTE — Assessment & Plan Note (Signed)
She has long-standing chronic shortness of breath. I suspect today's shortness of breath is likely multifactorial including obesity, deconditioning, acute on chronic diastolic CHF. We have recommended extra Lasix and recommended that she start a walking program

## 2013-05-28 NOTE — Progress Notes (Signed)
Patient ID: Brenda Gonzalez, female    DOB: 02-Jan-1936, 78 y.o.   MRN: [id number]  HPI Comments: 78 -year-old woman with ahistory of  chronic atrial fibrillation on warfarin, chronic chest pain, prior arm pain that was atypical in nature and comes on at rest.  stress test several years ago at [facility] that she reports was normal. She presents for routine followup. History of statin intolerance and significant hyperlipidemia.  On previous office visits, she has had several complaints, blood pressure has been running low at times, general malaise, diffuse pain across her abdomen, right flank, between her shoulder blades. MRI x2 by her report showing no significant spinal issues. She is on Neurontin for her chronic pain.  Today's visit, she reports having terrible sleep. She's not very active, does not do regular exercise program. She is having more shortness of breath, particularly in the evening. She takes Lasix daily Some lower extremity edema. Is scheduled for colonoscopy in one month time and we'll discontinue her warfarin for 5 days prior to the procedure.  Severe hyperlipidemia . She has refused statins in the past. Most recent total cholesterol 260, LDL 175   Prior Echocardiogram shows mild to moderately depressed EF, ejection fraction 40%, this was noted while in atrial fibrillation. Mild-to-moderate mitral regurgitation, mildly elevated right ventricular systolic pressure. This was prior to her diuresis and rhythm control at [facility].   EKG today shows atrial fibrillation with ventricular rate 81 beats per minute , PVC noted   Outpatient Encounter Prescriptions as of 05/28/2013  Medication Sig  . Ascorbic Acid (VITAMIN C) 1000 MG tablet Take 1,000 mg by mouth daily.  Marland Kitchen. aspirin 81 MG EC tablet Take 81 mg by mouth daily.    . calcium carbonate (OS-CAL) 600 MG TABS Take 600 mg by mouth 2 (two) times daily.   . cholecalciferol (VITAMIN D) 1000 UNITS tablet Take 1,000 Units by mouth daily.     . furosemide (LASIX) 20 MG tablet Take 20 mg by mouth daily.    . metoprolol (LOPRESSOR) 50 MG tablet Take 50 mg by mouth 2 (two) times daily.  . metoprolol tartrate (LOPRESSOR) 25 MG tablet Take 25 mg by mouth 2 (two) times daily.  . pantoprazole (PROTONIX) 40 MG tablet Take 40 mg by mouth daily.    . potassium chloride SA (K-DUR,KLOR-CON) 20 MEQ tablet Take 20 mEq by mouth daily.    . ramipril (ALTACE) 2.5 MG tablet Take 2.5 mg by mouth daily.    . Red Yeast Rice 600 MG CAPS Take 600 mg by mouth 2 (two) times daily.  . vitamin E 1000 UNIT capsule Take 1,000 Units by mouth daily.    Marland Kitchen. warfarin (COUMADIN) 5 MG tablet TAKE 1 TABLET BY MOUTH EVERY DAY AS DIRECTED    Review of Systems  Constitutional: Negative.   HENT: Negative.   Eyes: Negative.   Respiratory: Negative.   Cardiovascular: Negative.   Gastrointestinal: Negative.   Endocrine: Negative.   Musculoskeletal: Positive for arthralgias and back pain.  Skin: Negative.   Allergic/Immunologic: Negative.   Neurological: Negative.   Hematological: Negative.   Psychiatric/Behavioral: Negative.   All other systems reviewed and are negative.   BP 118/80  Pulse 81  Ht 5\' 3"  (1.6 m)  Wt 165 lb 12 oz (75.184 kg)  BMI 29.37 kg/m2  Physical Exam  Nursing note and vitals reviewed. Constitutional: She is oriented to person, place, and time. She appears well-developed and well-nourished.  HENT:  Head: Normocephalic.  Nose: Nose  normal.  Mouth/Throat: Oropharynx is clear and moist.  Eyes: Conjunctivae are normal. Pupils are equal, round, and reactive to light.  Neck: Normal range of motion. Neck supple. No JVD present.  Cardiovascular: Normal rate, S1 normal, S2 normal, normal heart sounds and intact distal pulses.  An irregularly irregular rhythm present. Exam reveals no gallop and no friction rub.   No murmur heard. Trace bilateral lower extremity edema  Pulmonary/Chest: Effort normal and breath sounds normal. No respiratory  distress. She has no wheezes. She has no rales. She exhibits no tenderness.  Abdominal: Soft. Bowel sounds are normal. She exhibits no distension. There is no tenderness.  Musculoskeletal: Normal range of motion. She exhibits no edema and no tenderness.  Lymphadenopathy:    She has no cervical adenopathy.  Neurological: She is alert and oriented to person, place, and time. Coordination normal.  Skin: Skin is warm and dry. No rash noted. No erythema.  Psychiatric: She has a normal mood and affect. Her behavior is normal. Judgment and thought content normal.    Assessment and Plan

## 2013-05-28 NOTE — Assessment & Plan Note (Signed)
Blood pressure is well controlled on today's visit. No changes made to the medications. 

## 2013-05-28 NOTE — Patient Instructions (Signed)
You are doing well. No medication changes were made.  PLease take extra lasix as needed for shortness of breath, Am and noon  Please call us if you have new issues that need to be addressed before your next appt.  Your physician wants you to follow-up in: 6 months.  You will receive a reminder letter in the mail two months in advance. If you don't receive a letter, please call our office to schedule the follow-up appointment.

## 2013-05-29 ENCOUNTER — Other Ambulatory Visit: Payer: Self-pay | Admitting: Cardiovascular Disease

## 2013-05-29 NOTE — Telephone Encounter (Signed)
Please review for refill. Thanks!  

## 2013-05-29 NOTE — Telephone Encounter (Signed)
Please review and refill, Thank you. 

## 2013-06-22 ENCOUNTER — Ambulatory Visit: Payer: Self-pay | Admitting: Unknown Physician Specialty

## 2013-07-01 ENCOUNTER — Ambulatory Visit (INDEPENDENT_AMBULATORY_CARE_PROVIDER_SITE_OTHER): Payer: Medicare Other

## 2013-07-01 DIAGNOSIS — I4891 Unspecified atrial fibrillation: Secondary | ICD-10-CM

## 2013-07-01 DIAGNOSIS — Z5181 Encounter for therapeutic drug level monitoring: Secondary | ICD-10-CM

## 2013-07-01 DIAGNOSIS — Z7901 Long term (current) use of anticoagulants: Secondary | ICD-10-CM

## 2013-07-01 LAB — POCT INR: INR: 2.1

## 2013-08-12 ENCOUNTER — Ambulatory Visit (INDEPENDENT_AMBULATORY_CARE_PROVIDER_SITE_OTHER): Payer: Medicare Other | Admitting: Pharmacist

## 2013-08-12 DIAGNOSIS — I4891 Unspecified atrial fibrillation: Secondary | ICD-10-CM

## 2013-08-12 DIAGNOSIS — Z7901 Long term (current) use of anticoagulants: Secondary | ICD-10-CM

## 2013-08-12 DIAGNOSIS — Z5181 Encounter for therapeutic drug level monitoring: Secondary | ICD-10-CM

## 2013-08-12 LAB — POCT INR: INR: 2

## 2013-09-23 ENCOUNTER — Ambulatory Visit (INDEPENDENT_AMBULATORY_CARE_PROVIDER_SITE_OTHER): Payer: Medicare Other | Admitting: *Deleted

## 2013-09-23 DIAGNOSIS — I4891 Unspecified atrial fibrillation: Secondary | ICD-10-CM

## 2013-09-23 DIAGNOSIS — Z7901 Long term (current) use of anticoagulants: Secondary | ICD-10-CM

## 2013-09-23 DIAGNOSIS — Z5181 Encounter for therapeutic drug level monitoring: Secondary | ICD-10-CM

## 2013-09-23 LAB — POCT INR: INR: 2.6

## 2013-10-28 ENCOUNTER — Ambulatory Visit (INDEPENDENT_AMBULATORY_CARE_PROVIDER_SITE_OTHER): Payer: Medicare Other

## 2013-10-28 DIAGNOSIS — I4891 Unspecified atrial fibrillation: Secondary | ICD-10-CM

## 2013-10-28 DIAGNOSIS — Z7901 Long term (current) use of anticoagulants: Secondary | ICD-10-CM

## 2013-10-28 DIAGNOSIS — Z5181 Encounter for therapeutic drug level monitoring: Secondary | ICD-10-CM

## 2013-10-28 LAB — POCT INR: INR: 2.8

## 2013-11-25 ENCOUNTER — Encounter: Payer: Self-pay | Admitting: Cardiovascular Disease

## 2013-11-25 ENCOUNTER — Ambulatory Visit (INDEPENDENT_AMBULATORY_CARE_PROVIDER_SITE_OTHER): Payer: Medicare Other

## 2013-11-25 ENCOUNTER — Ambulatory Visit (INDEPENDENT_AMBULATORY_CARE_PROVIDER_SITE_OTHER): Payer: Medicare Other | Admitting: Cardiovascular Disease

## 2013-11-25 VITALS — BP 100/80 | HR 82 | Ht 63.0 in | Wt 158.5 lb

## 2013-11-25 DIAGNOSIS — E785 Hyperlipidemia, unspecified: Secondary | ICD-10-CM

## 2013-11-25 DIAGNOSIS — Z5181 Encounter for therapeutic drug level monitoring: Secondary | ICD-10-CM

## 2013-11-25 DIAGNOSIS — B0229 Other postherpetic nervous system involvement: Secondary | ICD-10-CM

## 2013-11-25 DIAGNOSIS — Z7901 Long term (current) use of anticoagulants: Secondary | ICD-10-CM

## 2013-11-25 DIAGNOSIS — I10 Essential (primary) hypertension: Secondary | ICD-10-CM

## 2013-11-25 DIAGNOSIS — I4891 Unspecified atrial fibrillation: Secondary | ICD-10-CM

## 2013-11-25 DIAGNOSIS — I5033 Acute on chronic diastolic (congestive) heart failure: Secondary | ICD-10-CM

## 2013-11-25 LAB — POCT INR: INR: 1.8

## 2013-11-25 NOTE — Assessment & Plan Note (Signed)
Euvolemic on today's visit. Encouraged her to continue Lasix daily

## 2013-11-25 NOTE — Assessment & Plan Note (Signed)
Suggested she discuss her symptoms of flank pain with her primary care. She might benefit from a medication such as liver,, Cymbalta, Lidoderm patch. Could even try capsaicin cream

## 2013-11-25 NOTE — Progress Notes (Signed)
Patient ID: Brenda Gonzalez, female    DOB: 11-05-1935, 78 y.o.   MRN: [id number]  HPI Comments: 78 -year-old woman with a history of  chronic atrial fibrillation on warfarin, chronic chest pain, prior arm pain that was atypical in nature and comes on at rest.  stress test several years ago at [facility] that she reports was normal. She presents for routine followup. History of statin intolerance and significant hyperlipidemia. She reports prior episode of shingles  On today's visit, she reports that she continues to have pain in her right flank and right back which she attributes to recurrent shingles. No rash noted, no vesicles. She's had this pain through the entire summer. She reports having several MRI scans done for workup She is otherwise active, spends lots of time in her garden Denies having any significant chest pain or shortness of breath with exertion. Previously tried Neurontin for her chronic pain But states that she had side effects Typically has bad sleep, does not do regular exercise program.  She takes Lasix daily  Severe hyperlipidemia . She has refused statins in the past.  total cholesterol 260, LDL 175 She takes red yeast rice   Prior Echocardiogram shows mild to moderately depressed EF, ejection fraction 40%, this was noted while in atrial fibrillation. Mild-to-moderate mitral regurgitation, mildly elevated right ventricular systolic pressure. This was prior to her diuresis and rhythm control at [facility].   EKG today shows atrial fibrillation with ventricular rate 82  beats per minute , PVC noted  Outpatient Encounter Prescriptions as of 11/25/2013  Medication Sig  . Ascorbic Acid (VITAMIN C) 1000 MG tablet Take 1,000 mg by mouth daily.  Marland Kitchen. aspirin 81 MG EC tablet Take 81 mg by mouth daily.    . calcium carbonate (OS-CAL) 600 MG TABS Take 600 mg by mouth 2 (two) times daily.   . cholecalciferol (VITAMIN D) 1000 UNITS tablet Take 1,000 Units by mouth daily.    . Coenzyme  Q10-Levocarnitine (CO Q-10 PLUS PO) Take by mouth daily.  . furosemide (LASIX) 20 MG tablet TAKE 1 TABLET BY MOUTH ONCE A DAY  . furosemide (LASIX) 20 MG tablet Take 20 mg by mouth daily.  . metoprolol (LOPRESSOR) 50 MG tablet Take 50 mg by mouth 2 (two) times daily.  . pantoprazole (PROTONIX) 40 MG tablet Take 40 mg by mouth daily.    . potassium chloride SA (K-DUR,KLOR-CON) 20 MEQ tablet Take 20 mEq by mouth daily.    . ramipril (ALTACE) 2.5 MG tablet Take 2.5 mg by mouth daily.    . Red Yeast Rice 600 MG CAPS Take 600 mg by mouth 2 (two) times daily.  . vitamin E 1000 UNIT capsule Take 1,000 Units by mouth daily.    Marland Kitchen. warfarin (COUMADIN) 5 MG tablet TAKE 1 TABLET BY MOUTH EVERY DAY AS DIRECTED   Review of Systems  Constitutional: Negative.   HENT: Negative.   Eyes: Negative.   Respiratory: Negative.   Cardiovascular: Negative.   Gastrointestinal: Negative.   Endocrine: Negative.   Musculoskeletal: Positive for arthralgias and back pain.  Skin: Negative.   Allergic/Immunologic: Negative.   Neurological: Negative.   Hematological: Negative.   Psychiatric/Behavioral: Negative.   All other systems reviewed and are negative.   BP 100/80  Pulse 82  Ht 5\' 3"  (1.6 m)  Wt 158 lb 8 oz (71.895 kg)  BMI 28.08 kg/m2  Physical Exam  Nursing note and vitals reviewed. Constitutional: She is oriented to person, place, and time. She appears  well-developed and well-nourished.  HENT:  Head: Normocephalic.  Nose: Nose normal.  Mouth/Throat: Oropharynx is clear and moist.  Eyes: Conjunctivae are normal. Pupils are equal, round, and reactive to light.  Neck: Normal range of motion. Neck supple. No JVD present.  Cardiovascular: Normal rate, S1 normal, S2 normal, normal heart sounds and intact distal pulses.  An irregularly irregular rhythm present. Exam reveals no gallop and no friction rub.   No murmur heard. Trace bilateral lower extremity edema  Pulmonary/Chest: Effort normal and breath  sounds normal. No respiratory distress. She has no wheezes. She has no rales. She exhibits no tenderness.  Abdominal: Soft. Bowel sounds are normal. She exhibits no distension. There is no tenderness.  Musculoskeletal: Normal range of motion. She exhibits no edema and no tenderness.  Lymphadenopathy:    She has no cervical adenopathy.  Neurological: She is alert and oriented to person, place, and time. Coordination normal.  Skin: Skin is warm and dry. No rash noted. No erythema.  Psychiatric: She has a normal mood and affect. Her behavior is normal. Judgment and thought content normal.    Assessment and Plan

## 2013-11-25 NOTE — Patient Instructions (Addendum)
Your next appointment will be scheduled in our new office located at :  [facility]  [address], [address]  [place], [place] [id number]  You are doing well. No medication changes were made.  Post herpetic neuralgia: Lyrica, cymbalta, lidoderm patch  Please call us if you have new issues that need to be addressed before your next appt.  Your physician wants you to follow-up in: 12 months.  You will receive a reminder letter in the mail two months in advance. If you don't receive a letter, please call our office to schedule the follow-up appointment.

## 2013-11-25 NOTE — Assessment & Plan Note (Signed)
Blood pressure is well controlled on today's visit. No changes made to the medications. 

## 2013-11-25 NOTE — Assessment & Plan Note (Signed)
Heart rate well-controlled on metoprolol. Tolerating warfarin

## 2013-11-25 NOTE — Assessment & Plan Note (Signed)
She does not want a statin. Typically has very elevated total cholesterol

## 2013-11-25 NOTE — Assessment & Plan Note (Signed)
INR will be checked today

## 2013-11-27 ENCOUNTER — Other Ambulatory Visit: Payer: Self-pay

## 2013-11-28 ENCOUNTER — Other Ambulatory Visit: Payer: Self-pay | Admitting: *Deleted

## 2013-11-28 MED ORDER — WARFARIN SODIUM 5 MG PO TABS: 5.0000 mg | ORAL_TABLET | Freq: Once | ORAL | Status: DC

## 2013-11-30 ENCOUNTER — Other Ambulatory Visit: Payer: Self-pay

## 2013-11-30 MED ORDER — WARFARIN SODIUM 5 MG PO TABS: ORAL_TABLET | ORAL | Status: AC

## 2013-11-30 NOTE — Telephone Encounter (Signed)
This encounter was created in error - please disregard.

## 2013-12-15 ENCOUNTER — Ambulatory Visit: Payer: Self-pay | Admitting: Internal Medicine

## 2013-12-16 ENCOUNTER — Ambulatory Visit (INDEPENDENT_AMBULATORY_CARE_PROVIDER_SITE_OTHER): Payer: Medicare Other

## 2013-12-16 DIAGNOSIS — Z7901 Long term (current) use of anticoagulants: Secondary | ICD-10-CM

## 2013-12-16 DIAGNOSIS — I4891 Unspecified atrial fibrillation: Secondary | ICD-10-CM

## 2013-12-16 DIAGNOSIS — Z5181 Encounter for therapeutic drug level monitoring: Secondary | ICD-10-CM

## 2013-12-16 LAB — POCT INR: INR: 2.6

## 2013-12-28 ENCOUNTER — Telehealth: Payer: Self-pay | Admitting: Cardiovascular Disease

## 2013-12-28 NOTE — Telephone Encounter (Signed)
Pt is leaving message for Brenda Gonzalez letting us know she is on Gabapentin 100 mg, she was told to let us know if she was put on anything else.

## 2013-12-29 NOTE — Telephone Encounter (Signed)
Taking gabapentin 100 mg each evening.  States she is having some testing done and may need further medications, but will call if that happens

## 2014-01-12 ENCOUNTER — Ambulatory Visit: Payer: Self-pay

## 2014-01-13 ENCOUNTER — Ambulatory Visit (INDEPENDENT_AMBULATORY_CARE_PROVIDER_SITE_OTHER): Payer: Medicare Other

## 2014-01-13 DIAGNOSIS — I4891 Unspecified atrial fibrillation: Secondary | ICD-10-CM

## 2014-01-13 DIAGNOSIS — Z7901 Long term (current) use of anticoagulants: Secondary | ICD-10-CM

## 2014-01-13 DIAGNOSIS — Z5181 Encounter for therapeutic drug level monitoring: Secondary | ICD-10-CM

## 2014-01-13 LAB — POCT INR: INR: 2.3

## 2014-02-10 ENCOUNTER — Ambulatory Visit (INDEPENDENT_AMBULATORY_CARE_PROVIDER_SITE_OTHER): Payer: Medicare Other

## 2014-02-10 DIAGNOSIS — I4891 Unspecified atrial fibrillation: Secondary | ICD-10-CM

## 2014-02-10 DIAGNOSIS — Z5181 Encounter for therapeutic drug level monitoring: Secondary | ICD-10-CM

## 2014-02-10 DIAGNOSIS — Z7901 Long term (current) use of anticoagulants: Secondary | ICD-10-CM

## 2014-02-10 LAB — POCT INR: INR: 1.7

## 2014-02-24 ENCOUNTER — Ambulatory Visit: Payer: Self-pay | Admitting: Internal Medicine

## 2014-03-03 ENCOUNTER — Ambulatory Visit (INDEPENDENT_AMBULATORY_CARE_PROVIDER_SITE_OTHER): Payer: Medicare Other

## 2014-03-03 DIAGNOSIS — Z7901 Long term (current) use of anticoagulants: Secondary | ICD-10-CM

## 2014-03-03 DIAGNOSIS — I4891 Unspecified atrial fibrillation: Secondary | ICD-10-CM

## 2014-03-03 DIAGNOSIS — Z5181 Encounter for therapeutic drug level monitoring: Secondary | ICD-10-CM

## 2014-03-03 LAB — POCT INR: INR: 2

## 2014-03-18 ENCOUNTER — Ambulatory Visit: Payer: Self-pay | Admitting: Obstetrics and Gynecology

## 2014-03-22 ENCOUNTER — Ambulatory Visit: Payer: Self-pay | Admitting: Obstetrics and Gynecology

## 2014-03-24 ENCOUNTER — Other Ambulatory Visit: Payer: Self-pay | Admitting: Emergency Medicine

## 2014-03-24 DIAGNOSIS — I340 Nonrheumatic mitral (valve) insufficiency: Secondary | ICD-10-CM

## 2014-04-01 DIAGNOSIS — I639 Cerebral infarction, unspecified: Secondary | ICD-10-CM

## 2014-04-01 DIAGNOSIS — I482 Chronic atrial fibrillation: Secondary | ICD-10-CM

## 2014-04-01 DIAGNOSIS — I5042 Chronic combined systolic (congestive) and diastolic (congestive) heart failure: Secondary | ICD-10-CM

## 2014-04-03 DIAGNOSIS — I5021 Acute systolic (congestive) heart failure: Secondary | ICD-10-CM

## 2014-04-07 ENCOUNTER — Other Ambulatory Visit: Payer: Self-pay

## 2014-04-13 ENCOUNTER — Encounter
Admit: 2014-04-13 | Disposition: A | Discharge status: Home / Self Care | Payer: Self-pay | Attending: Internal Medicine | Admitting: Internal Medicine

## 2014-04-13 DEATH — deceased

## 2014-05-14 ENCOUNTER — Encounter
Admit: 2014-05-14 | Disposition: A | Discharge status: Home / Self Care | Payer: Self-pay | Attending: Internal Medicine | Admitting: Internal Medicine

## 2014-06-07 LAB — SURGICAL PATHOLOGY

## 2016-04-22 IMAGING — MR MRA HEAD WITHOUT CONTRAST
1 series · 26 of 48 positions shown · non-contrast
Comparison: Brain MRI 03/24/2014

CLINICAL DATA: Stroke.  Slurred speech and left facial droop.

EXAM:
MRA HEAD WITHOUT CONTRAST
TECHNIQUE: Angiographic images of the [place] of [place] were obtained using MRA
technique without intravenous contrast.

[Series 2: TOF · axial · 0.8mm · 0.39mm/px · z∈[-78,+27]mm · 26 of 138 slices shown]
[im 1/138]
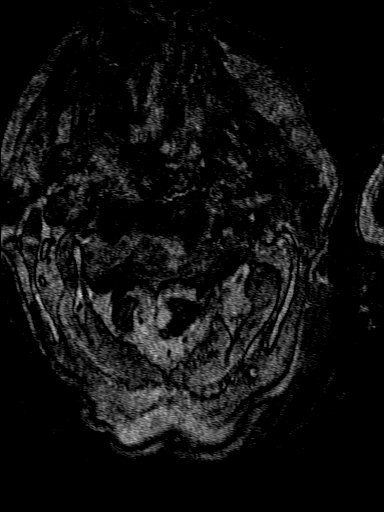
[im 3/138]
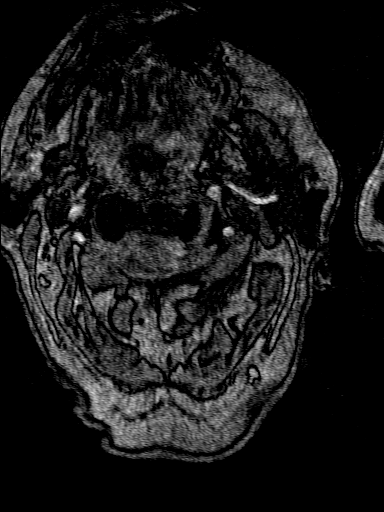
[im 6/138]
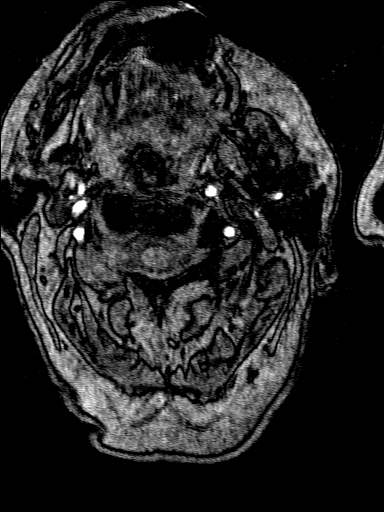
[im 9/138]
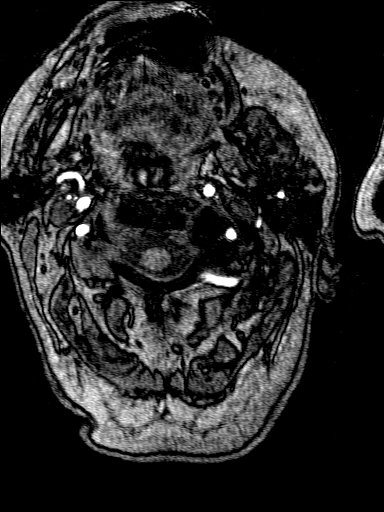
[im 12/138]
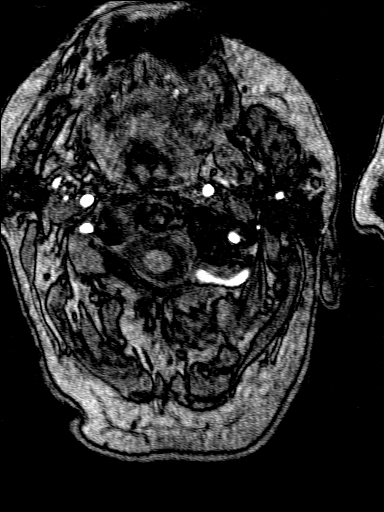
[im 15/138]
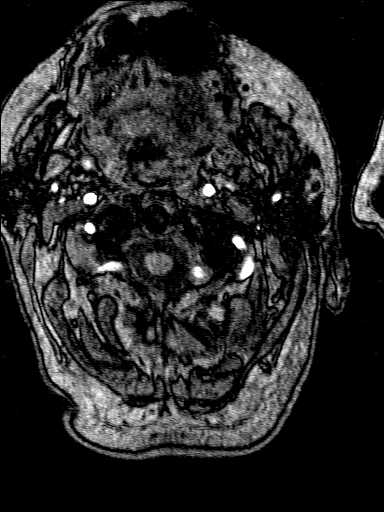
[im 18/138]
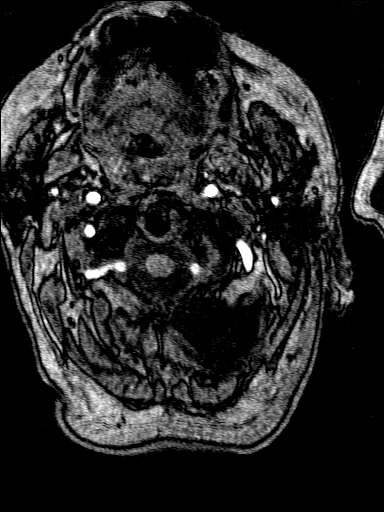
[im 21/138]
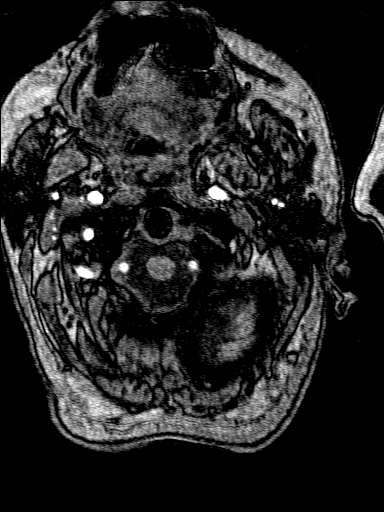
[im 24/138]
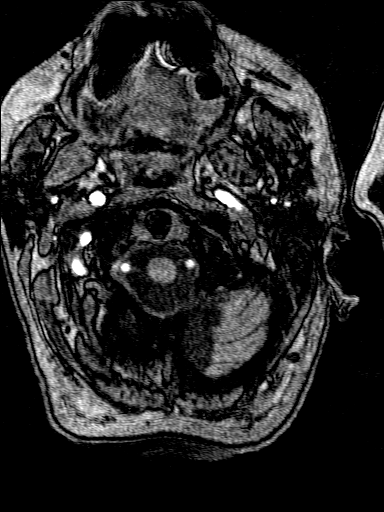
[im 27/138]
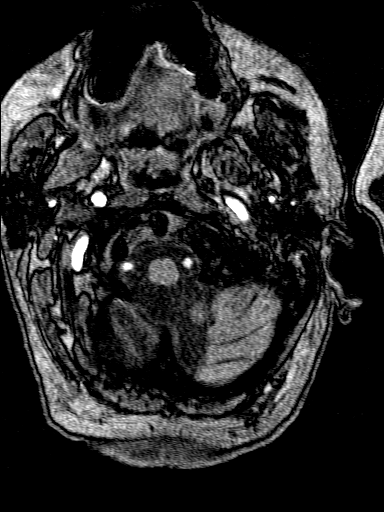
[im 30/138]
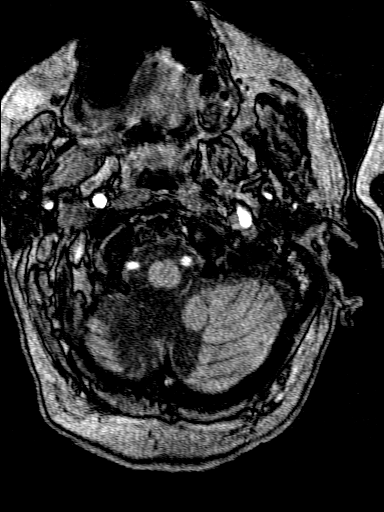
[im 33/138]
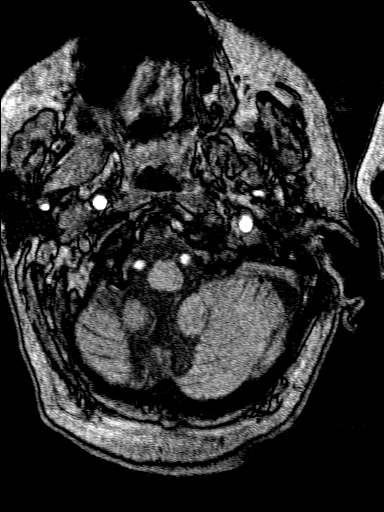
[im 35/138]
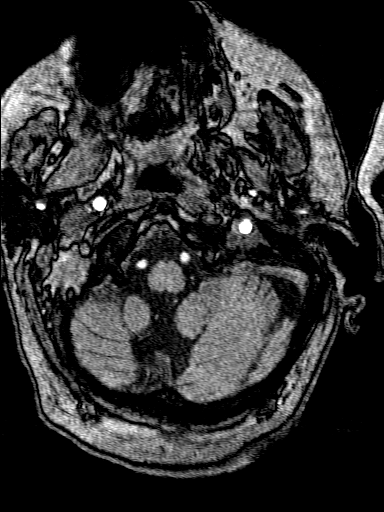
[im 38/138]
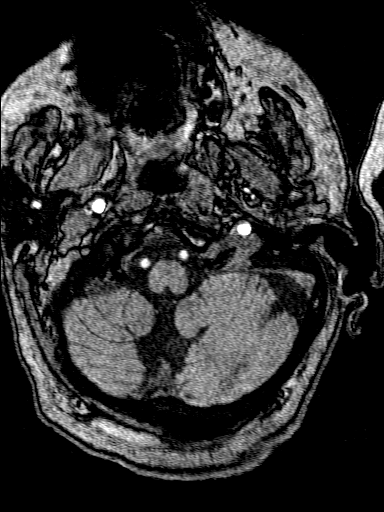
[im 41/138]
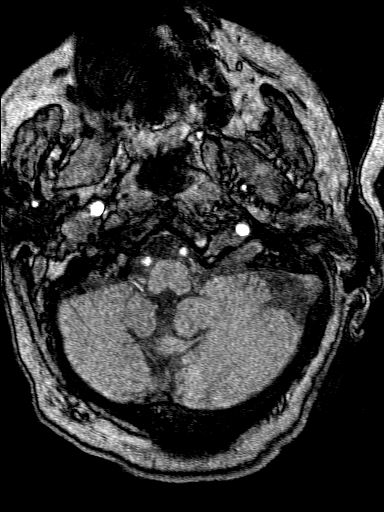
[im 44/138]
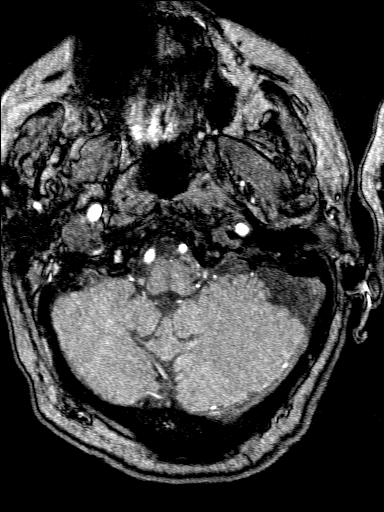
[im 47/138]
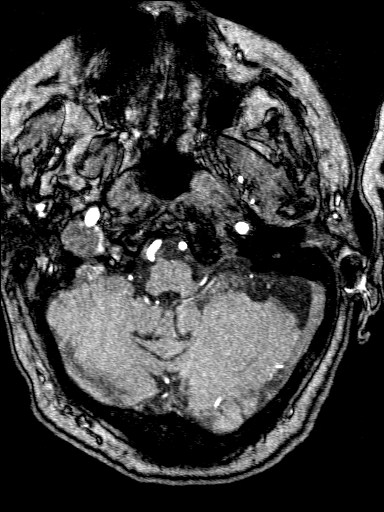
[im 50/138]
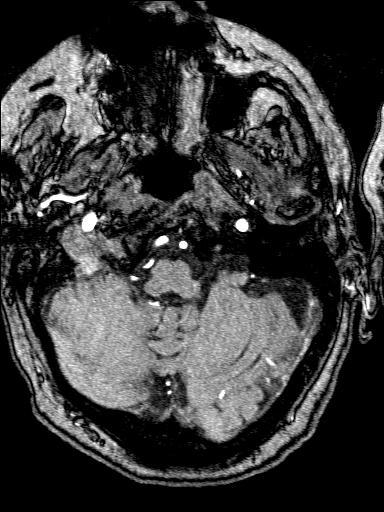
[im 53/138]
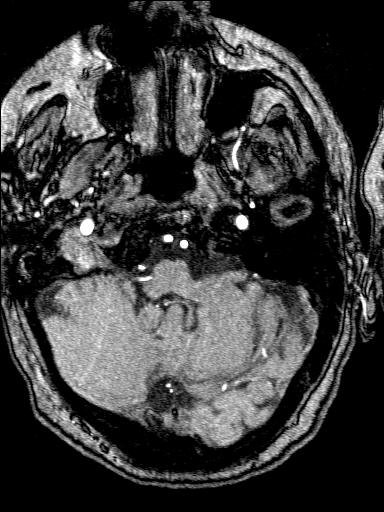
[im 62/138]
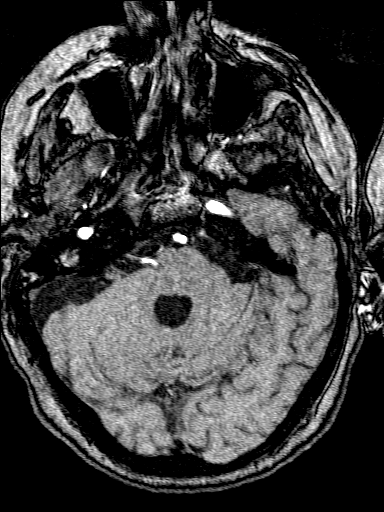
[im 70/138]
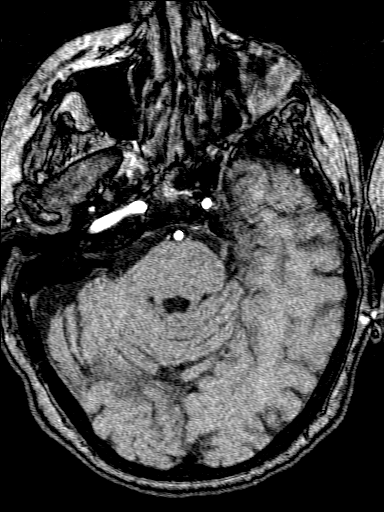
[im 79/138]
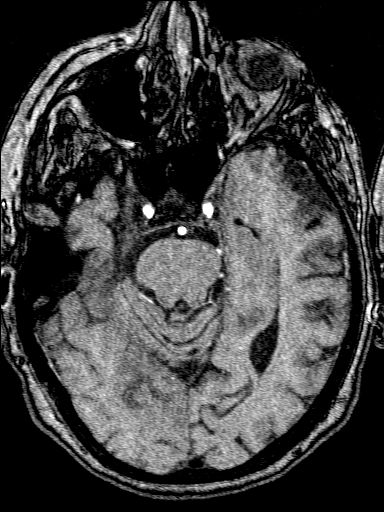
[im 97/138]
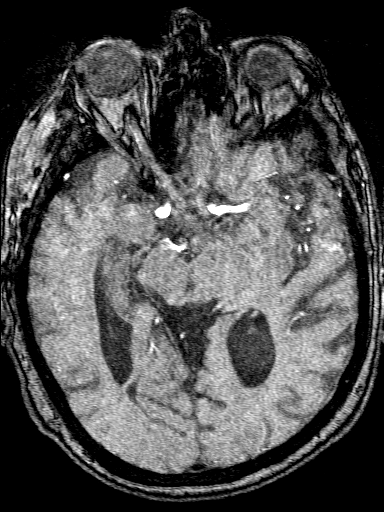
[im 114/138]
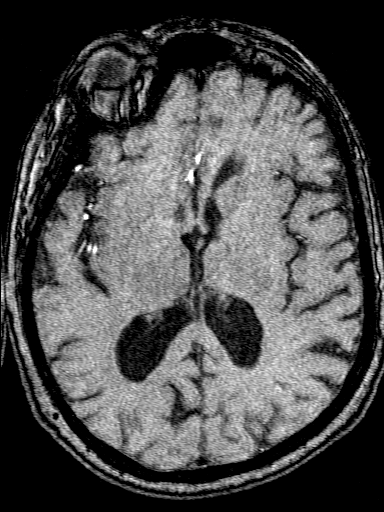
[im 117/138]
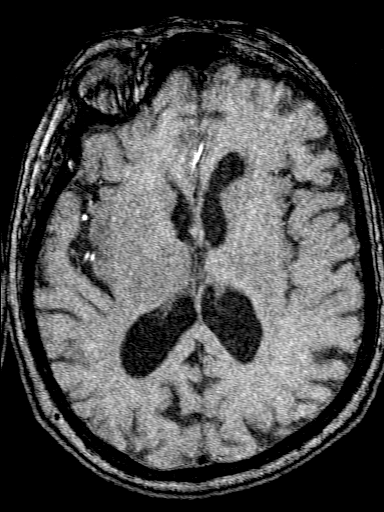
[im 132/138]
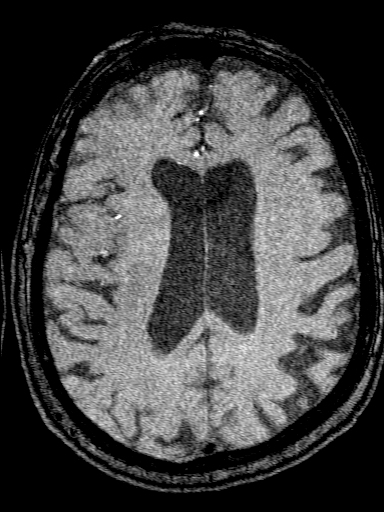

[26 of 48 positions shown; findings below may reference images not displayed]

FINDINGS: Images are mildly degraded by motion artifact.

Visualized distal vertebral arteries are patent and codominant
without stenosis. Right PICA origin is patent. Left PICA origin is
not well evaluated. AICA and SCA origins are patent. Basilar artery
is patent without stenosis. PCAs are patent without proximal
stenosis. There is mild bilateral PCA branch vessel irregularity.
Posterior communicating arteries are not clearly identified,
although there may be a tiny right posterior communicating artery.

Internal carotid arteries are patent from skullbase to carotid
termini. There may be very mild narrowing of the left cavernous
carotid anteriorly versus artifact. No significant intracranial ICA
stenosis is identified. The M1 segments are patent without stenosis.
There is mild bilateral M2 and more distal branch vessel
irregularity. ACAs are unremarkable. No intracranial aneurysm is
identified.
IMPRESSION: Mild intracranial atherosclerosis without evidence of major
intracranial arterial occlusion or significant proximal stenosis.
